# Patient Record
Sex: Female | Born: 1975 | Race: Black or African American | Hispanic: No | Smoking: Never smoker
Health system: Southern US, Community
[De-identification: ages and names within clinical notes are randomized; demographics above are authoritative.]

## PROBLEM LIST (undated history)

## (undated) DIAGNOSIS — R8290 Unspecified abnormal findings in urine: Secondary | ICD-10-CM

## (undated) DIAGNOSIS — N390 Urinary tract infection, site not specified: Secondary | ICD-10-CM

## (undated) DIAGNOSIS — G610 Guillain-Barre syndrome: Secondary | ICD-10-CM

## (undated) DIAGNOSIS — D696 Thrombocytopenia, unspecified: Secondary | ICD-10-CM

## (undated) HISTORY — DX: Unspecified abnormal findings in urine: R82.90

## (undated) HISTORY — DX: Urinary tract infection, site not specified: N39.0

## (undated) HISTORY — PX: SINUS ENDO WITH FUSION: SHX5329

---

## 2013-06-09 DIAGNOSIS — G610 Guillain-Barre syndrome: Secondary | ICD-10-CM

## 2013-06-09 DIAGNOSIS — D696 Thrombocytopenia, unspecified: Secondary | ICD-10-CM

## 2013-06-09 HISTORY — DX: Guillain-Barre syndrome: G61.0

## 2013-06-09 HISTORY — DX: Thrombocytopenia, unspecified: D69.6

## 2013-12-31 ENCOUNTER — Emergency Department (HOSPITAL_COMMUNITY)
Admission: EM | Admit: 2013-12-31 | Discharge: 2013-12-31 | Disposition: A | Discharge status: Home / Self Care | Payer: Medicaid - Out of State | Attending: Emergency Medicine | Admitting: Emergency Medicine

## 2013-12-31 ENCOUNTER — Encounter (HOSPITAL_COMMUNITY): Payer: Self-pay | Admitting: Emergency Medicine

## 2013-12-31 DIAGNOSIS — Z3202 Encounter for pregnancy test, result negative: Secondary | ICD-10-CM | POA: Insufficient documentation

## 2013-12-31 DIAGNOSIS — N390 Urinary tract infection, site not specified: Secondary | ICD-10-CM | POA: Insufficient documentation

## 2013-12-31 DIAGNOSIS — N949 Unspecified condition associated with female genital organs and menstrual cycle: Secondary | ICD-10-CM | POA: Diagnosis present

## 2013-12-31 LAB — URINE MICROSCOPIC-ADD ON

## 2013-12-31 LAB — URINALYSIS, ROUTINE W REFLEX MICROSCOPIC
Bilirubin Urine: NEGATIVE
Glucose, UA: NEGATIVE mg/dL
Hgb urine dipstick: NEGATIVE
Ketones, ur: NEGATIVE mg/dL
Nitrite: POSITIVE — AB
Protein, ur: NEGATIVE mg/dL
Specific Gravity, Urine: 1.015 (ref 1.005–1.030)
Urobilinogen, UA: 1 mg/dL (ref 0.0–1.0)
pH: 6 (ref 5.0–8.0)

## 2013-12-31 LAB — PREGNANCY, URINE: Preg Test, Ur: NEGATIVE

## 2013-12-31 MED ORDER — CEPHALEXIN 500 MG PO CAPS: 500.0000 mg | ORAL_CAPSULE | Freq: Two times a day (BID) | ORAL | Status: DC

## 2013-12-31 NOTE — ED Provider Notes (Signed)
CSN: [id number]     Arrival date & time 12/31/13  1513 History   First MD Initiated Contact with Patient 12/31/13 1523     Chief Complaint  Patient presents with  . Pelvic Pain     (Consider location/radiation/quality/duration/timing/severity/associated sxs/prior Treatment) HPI Comments: Pt states that she has been having foul smelling urine for the last 2 weeks. State that she started having intermittent sharp lower abdominal pain in the last 2 days. Denies vaginal discharge, fever,or back pain. Tried cranberry juice without success.  The history is provided by the patient. No language interpreter was used.    History reviewed. No pertinent past medical history. History reviewed. No pertinent past surgical history. History reviewed. No pertinent family history. History  Substance Use Topics  . Smoking status: Never Smoker   . Smokeless tobacco: Not on file  . Alcohol Use: Yes     Comment: occ   OB History   Grav Para Term Preterm Abortions TAB SAB Ect Mult Living                 Review of Systems  Constitutional: Negative.   Respiratory: Negative.   Cardiovascular: Negative.   Genitourinary: Positive for dysuria.      Allergies  Review of patient's allergies indicates no known allergies.  Home Medications   Prior to Admission medications   Not on File   BP 113/71  Pulse 101  Temp(Src) 99.1 F (37.3 C) (Oral)  Resp 18  Ht 5\' 7"  (1.702 m)  Wt 179 lb (81.194 kg)  BMI 28.03 kg/m2  SpO2 100%  LMP 12/25/2013 Physical Exam  Vitals reviewed. Constitutional: She is oriented to person, place, and time. She appears well-developed and well-nourished.  Cardiovascular: Normal rate and regular rhythm.   Pulmonary/Chest: Effort normal and breath sounds normal.  Abdominal: Soft. Bowel sounds are normal. There is no tenderness.  Musculoskeletal: Normal range of motion.  Neurological: She is alert and oriented to person, place, and time.  Skin: Skin is warm and dry.     ED Course  Procedures (including critical care time) Labs Review Labs Reviewed  URINALYSIS, ROUTINE W REFLEX MICROSCOPIC - Abnormal; Notable for the following:    Nitrite POSITIVE (*)    Leukocytes, UA SMALL (*)    All other components within normal limits  URINE MICROSCOPIC-ADD ON - Abnormal; Notable for the following:    Bacteria, UA MANY (*)    All other components within normal limits  URINE CULTURE  PREGNANCY, URINE    Imaging Review No results found.   EKG Interpretation None      MDM   Final diagnoses:  UTI (lower urinary tract infection)    Will treat for uti. Discussed symptoms that would warrant return with pt    Teressa Lower, NP 12/31/13 1600

## 2013-12-31 NOTE — ED Notes (Signed)
Patient states she is having sharp pelvic pain x 2 days and has noticed that her urine has a foul odor x 1 1/2 weeks.

## 2013-12-31 NOTE — Discharge Instructions (Signed)
If you continue to have symptoms when you are finished with the antibiotics or if the symptoms worsen while on the antibiotics you need to be reseen Urinary Tract Infection A urinary tract infection (UTI) can occur any place along the urinary tract. The tract includes the kidneys, ureters, bladder, and urethra. A type of germ called bacteria often causes a UTI. UTIs are often helped with antibiotic medicine.  HOME CARE   If given, take antibiotics as told by your doctor. Finish them even if you start to feel better.  Drink enough fluids to keep your pee (urine) clear or pale yellow.  Avoid tea, drinks with caffeine, and bubbly (carbonated) drinks.  Pee often. Avoid holding your pee in for a long time.  Pee before and after having sex (intercourse).  Wipe from front to back after you poop (bowel movement) if you are a woman. Use each tissue only once. GET HELP RIGHT AWAY IF:   You have back pain.  You have lower belly (abdominal) pain.  You have chills.  You feel sick to your stomach (nauseous).  You throw up (vomit).  Your burning or discomfort with peeing does not go away.  You have a fever.  Your symptoms are not better in 3 days. MAKE SURE YOU:   Understand these instructions.  Will watch your condition.  Will get help right away if you are not doing well or get worse. Document Released: 09/14/2007 Document Revised: 12/21/2011 Document Reviewed: 10/27/2011 [facility] Patient Information 2015 [place], [place]. This information is not intended to replace advice given to you by your health care provider. Make sure you discuss any questions you have with your health care provider.

## 2014-01-01 NOTE — ED Provider Notes (Signed)
Medical screening examination/treatment/procedure(s) were performed by non-physician practitioner and as supervising physician I was immediately available for consultation/collaboration.   EKG Interpretation None        [name], MD 01/01/14 1547 

## 2014-01-03 LAB — URINE CULTURE

## 2014-01-05 ENCOUNTER — Telehealth (HOSPITAL_BASED_OUTPATIENT_CLINIC_OR_DEPARTMENT_OTHER): Payer: Self-pay

## 2014-01-05 NOTE — Telephone Encounter (Signed)
Post ED Visit - Positive Culture Follow-up  Culture report reviewed by antimicrobial stewardship pharmacist: []  Wes Dulaney, Pharm.D., BCPS []  Celedonio Miyamoto, Pharm.D., BCPS []  Georgina Pillion, Pharm.D., BCPS []  [place], [address].D., BCPS, AAHIVP []  Estella Husk, Pharm.D., BCPS, AAHIVP [x]  Carly Sabat, Pharm.D. []  Enzo Bi, [address].D.  Positive Urine culture, >/= 100,000 colonies -> E Coli Treated with Cephalexin, organism sensitive to the same and no further patient follow-up is required at this time.  Arvid Right 01/05/2014, 12:57 AM

## 2014-02-17 ENCOUNTER — Encounter: Payer: Self-pay | Admitting: Adult Health

## 2014-02-17 ENCOUNTER — Other Ambulatory Visit: Payer: Self-pay | Admitting: Adult Health

## 2014-02-21 ENCOUNTER — Encounter (HOSPITAL_COMMUNITY): Payer: Self-pay | Admitting: Emergency Medicine

## 2014-02-21 ENCOUNTER — Emergency Department (HOSPITAL_COMMUNITY)
Admission: EM | Admit: 2014-02-21 | Discharge: 2014-02-21 | Discharge status: Against Medical Advice | Payer: Medicaid Other | Attending: Emergency Medicine | Admitting: Emergency Medicine

## 2014-02-21 DIAGNOSIS — R102 Pelvic and perineal pain: Secondary | ICD-10-CM | POA: Insufficient documentation

## 2014-02-21 HISTORY — DX: Guillain-Barre syndrome: G61.0

## 2014-02-21 LAB — URINALYSIS, ROUTINE W REFLEX MICROSCOPIC
Bilirubin Urine: NEGATIVE
GLUCOSE, UA: NEGATIVE mg/dL
HGB URINE DIPSTICK: NEGATIVE
KETONES UR: NEGATIVE mg/dL
LEUKOCYTES UA: NEGATIVE
Nitrite: NEGATIVE
PH: 6 (ref 5.0–8.0)
PROTEIN: NEGATIVE mg/dL
Specific Gravity, Urine: 1.025 (ref 1.005–1.030)
Urobilinogen, UA: 0.2 mg/dL (ref 0.0–1.0)

## 2014-02-21 NOTE — ED Notes (Signed)
Pt reports sharp lower pelvis pain with pungent urine odor. Denies blood but has urinary frequency. Pt denies emesis, fever or flank pain.

## 2014-03-10 ENCOUNTER — Other Ambulatory Visit: Payer: Medicaid Other | Admitting: Adult Health

## 2014-03-25 ENCOUNTER — Ambulatory Visit (INDEPENDENT_AMBULATORY_CARE_PROVIDER_SITE_OTHER): Payer: Medicaid Other | Admitting: Adult Health

## 2014-03-25 ENCOUNTER — Encounter: Payer: Self-pay | Admitting: Adult Health

## 2014-03-25 ENCOUNTER — Other Ambulatory Visit (HOSPITAL_COMMUNITY)
Admission: RE | Admit: 2014-03-25 | Discharge: 2014-03-25 | Disposition: A | Discharge status: Home / Self Care | Payer: Medicaid Other | Source: Ambulatory Visit | Attending: Adult Health | Admitting: Adult Health

## 2014-03-25 VITALS — BP 100/68 | HR 78 | Ht 66.5 in | Wt 179.5 lb

## 2014-03-25 DIAGNOSIS — Z01419 Encounter for gynecological examination (general) (routine) without abnormal findings: Secondary | ICD-10-CM

## 2014-03-25 DIAGNOSIS — N390 Urinary tract infection, site not specified: Secondary | ICD-10-CM | POA: Insufficient documentation

## 2014-03-25 DIAGNOSIS — Z113 Encounter for screening for infections with a predominantly sexual mode of transmission: Secondary | ICD-10-CM | POA: Diagnosis present

## 2014-03-25 DIAGNOSIS — Z1151 Encounter for screening for human papillomavirus (HPV): Secondary | ICD-10-CM | POA: Insufficient documentation

## 2014-03-25 DIAGNOSIS — Z0000 Encounter for general adult medical examination without abnormal findings: Secondary | ICD-10-CM

## 2014-03-25 DIAGNOSIS — R8290 Unspecified abnormal findings in urine: Secondary | ICD-10-CM | POA: Insufficient documentation

## 2014-03-25 DIAGNOSIS — Z1212 Encounter for screening for malignant neoplasm of rectum: Secondary | ICD-10-CM

## 2014-03-25 HISTORY — DX: Urinary tract infection, site not specified: N39.0

## 2014-03-25 HISTORY — DX: Unspecified abnormal findings in urine: R82.90

## 2014-03-25 LAB — HEMOCCULT GUIAC POC 1CARD (OFFICE): Fecal Occult Blood, POC: NEGATIVE

## 2014-03-25 LAB — POCT URINALYSIS DIPSTICK
Blood, UA: NEGATIVE
GLUCOSE UA: NEGATIVE
LEUKOCYTES UA: NEGATIVE
NITRITE UA: POSITIVE
Protein, UA: NEGATIVE

## 2014-03-25 MED ORDER — NITROFURANTOIN MONOHYD MACRO 100 MG PO CAPS: 100.0000 mg | ORAL_CAPSULE | Freq: Two times a day (BID) | ORAL | Status: DC

## 2014-03-25 NOTE — Progress Notes (Signed)
Patient ID: Stacey Fisher, female   DOB: 1976-04-03, 38 y.o.   MRN: [id number] History of Present Illness: Stacey Fisher is a 38 year old black female, divorced, in relationship, new to this practice, in for pap and physical and she complains of odor in urine and breast tenderness.She is not using birth control as partner wants a baby.   Current Medications, Allergies, Past Medical History, Past Surgical History, Family History and Social History were reviewed in [employer] record.     Review of Systems: Patient denies any headaches, blurred vision, shortness of breath, chest pain, abdominal pain, problems with bowel movements, urination, or intercourse. 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    Physical Exam:BP 100/68 mmHg  Pulse 78  Ht 5' 6.5" (1.689 m)  Wt 179 lb 8 oz (81.421 kg)  BMI 28.54 kg/m2  LMP 03/12/2014 Urine + nitrates General:  Well developed, well nourished, no acute distress Skin:  Warm and dry Neck:  Midline trachea, normal thyroid Lungs; Clear to auscultation bilaterally Breast:  No dominant palpable mass, retraction, or nipple discharge, has bilateral regular irregularities  Cardiovascular: Regular rate and rhythm Abdomen:  Soft, non tender, no hepatosplenomegaly Pelvic:  External genitalia is normal in appearance.  The vagina is normal in appearance. The cervix is bulbous.  Uterus is felt to be normal size, shape, and contour.  No  adnexal masses or tenderness noted. Rectal: Good sphincter tone, no polyps, or hemorrhoids felt.  Hemoccult negative. Extremities:  No swelling or varicosities noted Psych:  No mood changes,alert and cooperative,seems happy Discussed with her age increased risk for Down's.  Impression: Well woman gyn exam with pap in new pt Bad odor of urine UTI   Plan: Rx macrobid 1 bid x 7 days #14 no refills UA C&S sent Take folic acid Increase water Get mammogram now,  number given for [facility] Physical in 1 year Review handouts on UTI and preparing for pregnancy

## 2014-03-25 NOTE — Patient Instructions (Addendum)
Get mammogram now [phone] Physical in 1 year Take folic acid Increase water Take macrobid for UTI Urinary Tract Infection Urinary tract infections (UTIs) can develop anywhere along your urinary tract. Your urinary tract is your body's drainage system for removing wastes and extra water. Your urinary tract includes two kidneys, two ureters, a bladder, and a urethra. Your kidneys are a pair of bean-shaped organs. Each kidney is about the size of your fist. They are located below your ribs, one on each side of your spine. CAUSES Infections are caused by microbes, which are microscopic organisms, including fungi, viruses, and bacteria. These organisms are so small that they can only be seen through a microscope. Bacteria are the microbes that most commonly cause UTIs. SYMPTOMS  Symptoms of UTIs may vary by age and gender of the patient and by the location of the infection. Symptoms in young women typically include a frequent and intense urge to urinate and a painful, burning feeling in the bladder or urethra during urination. Older women and men are more likely to be tired, shaky, and weak and have muscle aches and abdominal pain. A fever may mean the infection is in your kidneys. Other symptoms of a kidney infection include pain in your back or sides below the ribs, nausea, and vomiting. DIAGNOSIS To diagnose a UTI, your caregiver will ask you about your symptoms. Your caregiver also will ask to provide a urine sample. The urine sample will be tested for bacteria and white blood cells. White blood cells are made by your body to help fight infection. TREATMENT  Typically, UTIs can be treated with medication. Because most UTIs are caused by a bacterial infection, they usually can be treated with the use of antibiotics. The choice of antibiotic and length of treatment depend on your symptoms and the type of bacteria causing your infection. HOME CARE INSTRUCTIONS  If you were prescribed antibiotics, take  them exactly as your caregiver instructs you. Finish the medication even if you feel better after you have only taken some of the medication.  Drink enough water and fluids to keep your urine clear or pale yellow.  Avoid caffeine, tea, and carbonated beverages. They tend to irritate your bladder.  Empty your bladder often. Avoid holding urine for long periods of time.  Empty your bladder before and after sexual intercourse.  After a bowel movement, women should cleanse from front to back. Use each tissue only once. SEEK MEDICAL CARE IF:   You have back pain.  You develop a fever.  Your symptoms do not begin to resolve within 3 days. SEEK IMMEDIATE MEDICAL CARE IF:   You have severe back pain or lower abdominal pain.  You develop chills.  You have nausea or vomiting.  You have continued burning or discomfort with urination. MAKE SURE YOU:   Understand these instructions.  Will watch your condition.  Will get help right away if you are not doing well or get worse. Document Released: 01/05/2005 Document Revised: 09/27/2011 Document Reviewed: 05/06/2011 [facility] Patient Information 2015 [place], [place]. This information is not intended to replace advice given to you by your health care provider. Make sure you discuss any questions you have with your health care provider. Preparing for Pregnancy Before trying to become pregnant, make an appointment with your health care provider (preconception care). The goal is to help you have a healthy, safe pregnancy. At your first appointment, your health care provider will:   Do a complete physical exam, including a Pap test.  Take a complete medical history.  Give you advice and help you resolve any problems. PRECONCEPTION CHECKLIST Here is a list of the basics to cover with your health care provider at your preconception visit:  Medical history.  Tell your health care provider about any diseases you have had. Many diseases can  affect your pregnancy.  Include your partner's medical history and family history.  Make sure you have been tested for sexually transmitted infections (STIs). These can affect your pregnancy. In some cases, they can be passed to your baby. Tell your health care provider about any history of STIs.  Make sure your health care provider knows about any previous problems you have had with conception or pregnancy.  Tell your health care provider about any medicine you take. This includes herbal supplements and over-the-counter medicines.  Make sure all your immunizations are up to date. You may need to make additional appointments.  Ask your health care provider if you need any vaccinations or if there are any you should avoid.  Diet.  It is especially important to eat a healthy, balanced diet with the right nutrients when you are pregnant.  Ask your health care provider to help you get to a healthy weight before pregnancy.  If you are overweight, you are at higher risk for certain complications. These include high blood pressure, diabetes, and preterm birth.  If you are underweight, you are more likely to have a low-birth-weight baby.  Lifestyle.  Tell your health care provider about lifestyle factors such as alcohol use, drug use, or smoking.  Describe any harmful substances you may be exposed to at work or home. These can include chemicals, pesticides, and radiation.  Mental health.  Let your health care provider know if you have been feeling depressed or anxious.  Let your health care provider know if you have a history of substance abuse.  Let your health care provider know if you do not feel safe at home. HOME INSTRUCTIONS TO PREPARE FOR PREGNANCY Follow your health care provider's advice and instructions.   Keep an accurate record of your menstrual periods. This makes it easier for your health care provider to determine your baby's due date.  Begin taking prenatal vitamins  and folic acid supplements daily. Take them as directed by your health care provider.  Eat a balanced diet. Get help from a nutrition counselor if you have questions or need help.  Get regular exercise. Try to be active for at least 30 minutes a day most days of the week.  Quit smoking, if you smoke.  Do not drink alcohol.  Do not take illegal drugs.  Get medical problems, such as diabetes or high blood pressure, under control.  If you have diabetes, make sure you do the following:  Have good blood sugar control. If you have type 1 diabetes, use multiple daily doses of insulin. Do not use split-dose or premixed insulin.  Have an eye exam by a qualified eye care professional trained in caring for people with diabetes.  Get evaluated by your health care provider for cardiovascular disease.  Get to a healthy weight. If you are overweight or obese, reduce your weight with the help of a qualified health professional such as a Museum/gallery exhibitions officerregistered dietitian. Ask your health care provider what the right weight range is for you. HOW DO I KNOW I AM PREGNANT? You may be pregnant if you have been sexually active and you miss your period. Symptoms of early pregnancy include:   Mild  cramping.  Very light vaginal bleeding (spotting).  Feeling unusually tired.  Morning sickness. If you have any of these symptoms, take a home pregnancy test. These tests look for a hormone called human chorionic gonadotropin (hCG) in your urine. Your body begins to make this hormone during early pregnancy. These tests are very accurate. Wait until at least the first day you miss your period to take one. If you get a positive result, call your health care provider to make appointments for prenatal care. WHAT SHOULD I DO IF I BECOME PREGNANT?  Make an appointment with your health care provider by week 12 of your pregnancy at the latest.  Do not smoke. Smoking can be harmful to your baby.  Do not drink alcoholic beverages.  Alcohol is related to a number of birth defects.  Avoid toxic odors and chemicals.  You may continue to have sexual intercourse if it does not cause pain or other problems, such as vaginal bleeding. Document Released: 03/10/2008 Document Revised: 08/12/2013 Document Reviewed: 03/04/2013 [facility] Patient Information 2015 [place], [place]. This information is not intended to replace advice given to you by your health care provider. Make sure you discuss any questions you have with your health care provider.

## 2014-03-26 LAB — URINALYSIS
BILIRUBIN URINE: NEGATIVE
GLUCOSE, UA: NEGATIVE mg/dL
Hgb urine dipstick: NEGATIVE
Ketones, ur: NEGATIVE mg/dL
LEUKOCYTES UA: NEGATIVE
Nitrite: POSITIVE — AB
PH: 7.5 (ref 5.0–8.0)
PROTEIN: NEGATIVE mg/dL
Specific Gravity, Urine: 1.016 (ref 1.005–1.030)
Urobilinogen, UA: 1 mg/dL (ref 0.0–1.0)

## 2014-03-26 LAB — CYTOLOGY - PAP

## 2014-03-28 LAB — URINE CULTURE: Colony Count: >=100000

## 2014-04-16 ENCOUNTER — Other Ambulatory Visit: Payer: Self-pay | Admitting: Adult Health

## 2014-04-16 DIAGNOSIS — Z1231 Encounter for screening mammogram for malignant neoplasm of breast: Secondary | ICD-10-CM

## 2014-04-21 ENCOUNTER — Ambulatory Visit (HOSPITAL_COMMUNITY): Payer: Medicaid - Out of State

## 2014-04-28 ENCOUNTER — Ambulatory Visit (HOSPITAL_COMMUNITY)
Admission: RE | Admit: 2014-04-28 | Discharge: 2014-04-28 | Disposition: A | Discharge status: Home / Self Care | Payer: Medicaid Other | Source: Ambulatory Visit | Attending: Adult Health | Admitting: Adult Health

## 2014-04-28 DIAGNOSIS — R928 Other abnormal and inconclusive findings on diagnostic imaging of breast: Secondary | ICD-10-CM | POA: Diagnosis not present

## 2014-04-28 DIAGNOSIS — Z1231 Encounter for screening mammogram for malignant neoplasm of breast: Secondary | ICD-10-CM | POA: Diagnosis not present

## 2014-05-01 ENCOUNTER — Other Ambulatory Visit: Payer: Self-pay | Admitting: Adult Health

## 2014-05-01 DIAGNOSIS — R928 Other abnormal and inconclusive findings on diagnostic imaging of breast: Secondary | ICD-10-CM

## 2014-05-06 ENCOUNTER — Encounter (HOSPITAL_COMMUNITY): Payer: Self-pay | Admitting: Emergency Medicine

## 2014-05-06 ENCOUNTER — Inpatient Hospital Stay (HOSPITAL_COMMUNITY)
Admission: EM | Admit: 2014-05-06 | Discharge: 2014-05-08 | DRG: 060 | Disposition: A | Discharge status: Home / Self Care | Payer: Medicaid Other | Attending: Internal Medicine | Admitting: Internal Medicine

## 2014-05-06 ENCOUNTER — Emergency Department (HOSPITAL_COMMUNITY): Payer: Medicaid Other

## 2014-05-06 DIAGNOSIS — G35D Multiple sclerosis, unspecified: Secondary | ICD-10-CM

## 2014-05-06 DIAGNOSIS — G35 Multiple sclerosis: Principal | ICD-10-CM | POA: Diagnosis present

## 2014-05-06 DIAGNOSIS — R531 Weakness: Secondary | ICD-10-CM

## 2014-05-06 DIAGNOSIS — R200 Anesthesia of skin: Secondary | ICD-10-CM

## 2014-05-06 HISTORY — DX: Thrombocytopenia, unspecified: D69.6

## 2014-05-06 LAB — URINALYSIS, ROUTINE W REFLEX MICROSCOPIC
Bilirubin Urine: NEGATIVE
GLUCOSE, UA: NEGATIVE mg/dL
KETONES UR: NEGATIVE mg/dL
NITRITE: NEGATIVE
Protein, ur: NEGATIVE mg/dL
SPECIFIC GRAVITY, URINE: 1.015 (ref 1.005–1.030)
UROBILINOGEN UA: 1 mg/dL (ref 0.0–1.0)
pH: 7 (ref 5.0–8.0)

## 2014-05-06 LAB — CBC WITH DIFFERENTIAL/PLATELET
Basophils Absolute: 0 10*3/uL (ref 0.0–0.1)
Basophils Relative: 1 % (ref 0–1)
EOS ABS: 0.4 10*3/uL (ref 0.0–0.7)
Eosinophils Relative: 10 % — ABNORMAL HIGH (ref 0–5)
HCT: 39.6 % (ref 36.0–46.0)
Hemoglobin: 12.7 g/dL (ref 12.0–15.0)
LYMPHS ABS: 1.4 10*3/uL (ref 0.7–4.0)
LYMPHS PCT: 32 % (ref 12–46)
MCH: 29.3 pg (ref 26.0–34.0)
MCHC: 32.1 g/dL (ref 30.0–36.0)
MCV: 91.5 fL (ref 78.0–100.0)
Monocytes Absolute: 0.6 10*3/uL (ref 0.1–1.0)
Monocytes Relative: 14 % — ABNORMAL HIGH (ref 3–12)
NEUTROS ABS: 1.9 10*3/uL (ref 1.7–7.7)
NEUTROS PCT: 43 % (ref 43–77)
PLATELETS: 154 10*3/uL (ref 150–400)
RBC: 4.33 MIL/uL (ref 3.87–5.11)
RDW: 14 % (ref 11.5–15.5)
WBC: 4.3 10*3/uL (ref 4.0–10.5)

## 2014-05-06 LAB — BASIC METABOLIC PANEL
BUN: 10 mg/dL (ref 6–23)
CALCIUM: 8.5 mg/dL (ref 8.4–10.5)
CHLORIDE: 109 mmol/L (ref 96–112)
CO2: 28 mmol/L (ref 19–32)
CREATININE: 0.89 mg/dL (ref 0.50–1.10)
GFR calc non Af Amer: 80 mL/min — ABNORMAL LOW (ref >90)
Glucose, Bld: 92 mg/dL (ref 70–99)
Potassium: 3.7 mmol/L (ref 3.5–5.1)
SODIUM: 139 mmol/L (ref 135–145)

## 2014-05-06 LAB — POC URINE PREG, ED: PREG TEST UR: NEGATIVE

## 2014-05-06 LAB — URINE MICROSCOPIC-ADD ON

## 2014-05-06 LAB — TSH: TSH: 0.573 u[IU]/mL (ref 0.350–4.500)

## 2014-05-06 MED ORDER — METHYLPREDNISOLONE SODIUM SUCC 1000 MG IJ SOLR
INTRAMUSCULAR | Status: AC
  Filled 2014-05-06: qty 8

## 2014-05-06 MED ORDER — MORPHINE SULFATE 2 MG/ML IJ SOLN
2.0000 mg | INTRAMUSCULAR | Status: DC | PRN
  Filled 2014-05-06: qty 1

## 2014-05-06 MED ORDER — HEPARIN SODIUM (PORCINE) 5000 UNIT/ML IJ SOLN
5000.0000 [IU] | Freq: Three times a day (TID) | INTRAMUSCULAR | Status: DC
  Administered 2014-05-06: 5000 [IU] via SUBCUTANEOUS
  Filled 2014-05-06: qty 1

## 2014-05-06 MED ORDER — GADOBENATE DIMEGLUMINE 529 MG/ML IV SOLN
15.0000 mL | Freq: Once | INTRAVENOUS | Status: AC | PRN
  Administered 2014-05-06: 15 mL via INTRAVENOUS

## 2014-05-06 MED ORDER — METHYLPREDNISOLONE SODIUM SUCC 125 MG IJ SOLR: 60.0000 mg | Freq: Four times a day (QID) | INTRAMUSCULAR | Status: DC

## 2014-05-06 MED ORDER — METHYLPREDNISOLONE SODIUM SUCC 1000 MG IJ SOLR
1000.0000 mg | INTRAMUSCULAR | Status: DC
  Administered 2014-05-06 – 2014-05-08 (×3): 1000 mg via INTRAVENOUS
  Filled 2014-05-06 (×3): qty 8

## 2014-05-06 MED ORDER — SODIUM CHLORIDE 0.9 % IV SOLN
Freq: Once | INTRAVENOUS | Status: AC
  Administered 2014-05-06: 09:00:00 via INTRAVENOUS

## 2014-05-06 MED ORDER — SODIUM CHLORIDE 0.9 % IV SOLN
1000.0000 mg | Freq: Once | INTRAVENOUS | Status: AC
  Administered 2014-05-06: 1000 mg via INTRAVENOUS
  Filled 2014-05-06: qty 8

## 2014-05-06 MED ORDER — ACETAMINOPHEN 325 MG PO TABS
650.0000 mg | ORAL_TABLET | Freq: Four times a day (QID) | ORAL | Status: DC | PRN
  Administered 2014-05-07 – 2014-05-08 (×2): 650 mg via ORAL
  Filled 2014-05-06 (×3): qty 2

## 2014-05-06 MED ORDER — METHYLPREDNISOLONE SODIUM SUCC 125 MG IJ SOLR: 1000.0000 mg | Freq: Once | INTRAMUSCULAR | Status: DC

## 2014-05-06 MED ORDER — ACETAMINOPHEN 650 MG RE SUPP: 650.0000 mg | Freq: Four times a day (QID) | RECTAL | Status: DC | PRN

## 2014-05-06 NOTE — Consult Note (Signed)
Felsenthal A. Stacey Laughter, MD     [email or web address]          Stacey Fisher is an 39 y.o. female.   ASSESSMENT/PLAN: 1. Acute/subacute numbness involving the left truncal region. The MRI findings, age and prior presentation makes the multiple sclerosis most likely etiology. 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 The patient will be initiated on high dose Solu-Medrol 1 g IV for 3-4 days. We will do a spinal tap obtaining the MS panel and also routine labs. Extensive additional labs will be obtained including ANA, sedimentation rate, C-reactive protein and other labs. Spinal fluid also be assessed for neurosarcoidosis. Also I may possibly consider titers for Sjogren's syndrome. The case is discussed quite lengthily with the patient. She will need to be placed on immune-modulating therapy at some point in time. Initial discussions were had regarding this but further discussion will need to be made at a later date.   This is a 39 year old black female who presents with nose and tingling involving the left truncal region over the last 3 days. The patient decided to seek medical attention as the symptoms were not improving and may be getting worse. There are some reports of gait imbalance. She had similar complaints about a year ago March 2015. At times she developed numbness from the waist down. She was treated at the hospital in [place] in the [place] area. She tells me that she did have a spinal tap and did have a lumbar spine MRI. However, she does not report having any thoracic or cervical spine MRI. MRI of the brain was not done. She tells me that they did raise concern of possible MS but Guillain-Barr was made the diagnosis. A spinal tap was done. I did ask her about loss her reflexes but she tell me that she did not lose reflexes at that time. This is  true this would argue against a diagnosis Guillain-Barr syndrome. In any case, she did receive the typical dose of immunoglobulins and had a very good response. The patient denies any focal weakness at this time. She denies any diplopia, dysarthria, loss of vision or other symptoms. The review of systems otherwise negative.  GENERAL: This is a pleasant female in no acute distress.  HEENT: Supple. Atraumatic normocephalic.   ABDOMEN: soft  EXTREMITIES: No edema   BACK: Normal.  SKIN: Normal by inspection.    MENTAL STATUS: Alert and oriented. Speech, language and cognition are generally intact. Judgment and insight normal.   CRANIAL NERVES: Pupils are equal, round and reactive to light and accommodation; extra ocular movements are full, there is no significant nystagmus; visual fields are full; upper and lower facial muscles are normal in strength and symmetric, there is no flattening of the nasolabial folds; tongue is midline; uvula is midline; shoulder elevation is normal.  MOTOR: Normal tone, bulk and strength; no pronator drift.  COORDINATION: Left finger to nose is normal, right finger to nose is normal, No rest tremor; no intention tremor; no postural tremor; no bradykinesia.  REFLEXES: Deep tendon reflexes are symmetrical and normal. Babinski reflexes are flexor bilaterally.   SENSATION: Reduced sensation to temperature light touch involving the left thoracic and abdominal region extending from approximately L1 to about T5.  The brain MRI is reviewed in person. They are several lesions seen on diffusion imaging which are perpendicular to the ventricle. Most of these are moderate in  size. I counted approximately 10 including a cerebellar lesion on the left which enhances. There are 2 large white matter lesions involving the corona radiata on the right side. I suspect these lesions most likely experiencing her symptoms at this time.  The cervical spine MRI is also reviewed and shows  4 increased signal seen on T2. These are consistent with multiple sclerosis.   Blood pressure 121/71, pulse 63, temperature 98.7 F (37.1 C), temperature source Oral, resp. rate 16, height _0  (1.702 m), weight 82.555 kg (182 lb), last menstrual period 05/06/2014, SpO2 100 %.  Past Medical History  Diagnosis Date  . Guillain-Barre 06/2013    "presumed"  . Bad odor of urine 03/25/2014  . UTI (lower urinary tract infection) 03/25/2014  . Thrombocytopenia 06/2013    Past Surgical History  Procedure Laterality Date  . Sinus endo with fusion      Family History  Problem Relation Age of Onset  . Cancer Other     maternal great grandma-colon cancer  . Asthma Daughter   . Hyperlipidemia Maternal Grandmother   . Diabetes Paternal Grandmother   . Hypertension Paternal Grandmother   . Stroke Paternal Grandmother   . Asthma Daughter   . Other Father     Guillain-Barre    Social History:  reports that she has never smoked. She has never used smokeless tobacco. She reports that she drinks about 3.0 oz of alcohol per week. She reports that she does not use illicit drugs.  Allergies: No Known Allergies  Medications: Prior to Admission medications   Medication Sig Start Date End Date Taking? Authorizing Provider  nitrofurantoin, macrocrystal-monohydrate, (MACROBID) 100 MG capsule Take 1 capsule (100 mg total) by mouth 2 (two) times daily. Patient not taking: Reported on 05/06/2014 03/25/14   Stacey Dooms, NP    Scheduled Meds: . heparin  5,000 Units Subcutaneous 3 times per day  . methylPREDNISolone (SOLU-MEDROL) injection  60 mg Intravenous Q6H   Continuous Infusions:  PRN Meds:.acetaminophen **OR** acetaminophen, morphine injection     Results for orders placed or performed during the hospital encounter of 05/06/14 (from the past 48 hour(s))  Urinalysis, Routine w reflex microscopic     Status: Abnormal   Collection Time: 05/06/14  8:42 AM  Result Value Ref Range    Color, Urine YELLOW YELLOW   APPearance CLEAR CLEAR   Specific Gravity, Urine 1.015 1.005 - 1.030   pH 7.0 5.0 - 8.0   Glucose, UA NEGATIVE NEGATIVE mg/dL   Hgb urine dipstick LARGE (A) NEGATIVE   Bilirubin Urine NEGATIVE NEGATIVE   Ketones, ur NEGATIVE NEGATIVE mg/dL   Protein, ur NEGATIVE NEGATIVE mg/dL   Urobilinogen, UA 1.0 0.0 - 1.0 mg/dL   Nitrite NEGATIVE NEGATIVE   Leukocytes, UA TRACE (A) NEGATIVE  Urine microscopic-add on     Status: Abnormal   Collection Time: 05/06/14  8:42 AM  Result Value Ref Range   Squamous Epithelial / LPF MANY (A) RARE   RBC / HPF TOO NUMEROUS TO COUNT <3 RBC/hpf   Bacteria, UA MANY (A) RARE  POC Urine Pregnancy, ED (do NOT order at [facility])     Status: None   Collection Time: 05/06/14  8:44 AM  Result Value Ref Range   Preg Test, Ur NEGATIVE NEGATIVE    Comment:        THE SENSITIVITY OF THIS METHODOLOGY IS >24 mIU/mL   CBC with Differential     Status: Abnormal   Collection Time: 05/06/14  8:45 AM  Result Value Ref Range   WBC 4.3 4.0 - 10.5 K/uL   RBC 4.33 3.87 - 5.11 MIL/uL   Hemoglobin 12.7 12.0 - 15.0 g/dL   HCT 39.6 36.0 - 46.0 %   MCV 91.5 78.0 - 100.0 fL   MCH 29.3 26.0 - 34.0 pg   MCHC 32.1 30.0 - 36.0 g/dL   RDW 14.0 11.5 - 15.5 %   Platelets 154 150 - 400 K/uL   Neutrophils Relative % 43 43 - 77 %   Neutro Abs 1.9 1.7 - 7.7 K/uL   Lymphocytes Relative 32 12 - 46 %   Lymphs Abs 1.4 0.7 - 4.0 K/uL   Monocytes Relative 14 (H) 3 - 12 %   Monocytes Absolute 0.6 0.1 - 1.0 K/uL   Eosinophils Relative 10 (H) 0 - 5 %   Eosinophils Absolute 0.4 0.0 - 0.7 K/uL   Basophils Relative 1 0 - 1 %   Basophils Absolute 0.0 0.0 - 0.1 K/uL  Basic metabolic panel     Status: Abnormal   Collection Time: 05/06/14  8:45 AM  Result Value Ref Range   Sodium 139 135 - 145 mmol/L   Potassium 3.7 3.5 - 5.1 mmol/L   Chloride 109 96 - 112 mmol/L   CO2 28 19 - 32 mmol/L   Glucose, Bld 92 70 - 99 mg/dL   BUN 10 6 - 23 mg/dL   Creatinine, Ser 0.89 0.50  - 1.10 mg/dL   Calcium 8.5 8.4 - 10.5 mg/dL   GFR calc non Af Amer 80 (L) >90 mL/min   GFR calc Af Amer >90 >90 mL/min    Comment: (NOTE) The eGFR has been calculated using the CKD EPI equation. This calculation has not been validated in all clinical situations. eGFR's persistently <90 mL/min signify possible Chronic Kidney Disease.    Anion gap NOT CALCULATED 5 - 15    Studies/Results:  MRI BRAIN Multiple small white matter lesions bilaterally. Many of these are periventricular in location and are characteristic of multiple sclerosis. Small enhancing lesion in the right cerebellum may be an area of acute demyelination.  Negative for acute infarct or mass.  CSPINE MRI Multiple nonenhancing areas of white matter disease in the cervical spinal cord consistent with multiple sclerosis. The findings are not consistent with Guillain-Barre syndrome.   Leston Schueller A. Stacey Fisher, M.D.  Diplomate, Tax adviser of Psychiatry and Neurology ( Neurology). 05/06/2014, 6:39 PM

## 2014-05-06 NOTE — ED Notes (Signed)
MD at bedside. 

## 2014-05-06 NOTE — Care Management Note (Signed)
    Page 1 of 1   05/08/2014     12:42:15 PM CARE MANAGEMENT NOTE 05/08/2014  Patient:  Stacey Fisher, Stacey Fisher   Account Number:  [id number]  Date Initiated:  05/06/2014  Documentation initiated by:  Anibal Henderson  Subjective/Objective Assessment:   pt is being admitted with possible new onset of MS. She lives at home with spouse, and is usually independent. Recent Dx of possible Guillain-Barre in March,  but was doing well until several days ago when she developed lt sided tingling     Action/Plan:   and numbness- MRI shows possible MS. Dr Gerilyn Pilgrim consulting. Pt plans to return home at D/C- may need DME   Anticipated DC Date:  05/07/2014   Anticipated DC Plan:           Choice offered to / List presented to:             Status of service:  In process, will continue to follow Medicare Important Message given?   (If response is "NO", the following Medicare IM given date fields will be blank) Date Medicare IM given:   Medicare IM given by:   Date Additional Medicare IM given:   Additional Medicare IM given by:    Discharge Disposition:    Per UR Regulation:    If discussed at Long Length of Stay Meetings, dates discussed:    Comments:  05/08/14 1235 Arlyss Queen, RN BSN CM Pt discharged home today. Pt stated she saw a Dr. French Ana recently for a physical but she has a new doctor that she is calling to make followup appt with at discharge. No other CM needs noted. Pt also will follow up with Dr. Gerilyn Pilgrim.  05/06/14 1430 Geneva bolden RN/CM pt has no PCP in the area. Was given the [employer]

## 2014-05-06 NOTE — H&P (Signed)
[facility] History and Physical  Stacey Fisher ZOX:[id number] DOB: 1975-07-13 DOA: 05/06/2014  Referring physician: Emergency Department PCP: No PCP Per Patient   Chief Complaint: Numbness  HPI: Stacey Fisher is a 39 y.o. female  With no significant past medical history who presented to the ED with worsening descending weakness that started recently. 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 In the Ed, Neurology was initially consulted, who recommended MRI of the brain and c spine. Imaging demonstrated multiple small white matter lesions bilaterally suggestive of MS. Neurology recommended medical admission. Pt was started on IV steroids and hospitalist consulted for admission.   Review of Systems:  Per above, the remainder of the 10pt ros reviewed and are neg  Past Medical History  Diagnosis Date  . Guillain-Barre 06/2013    "presumed"  . Bad odor of urine 03/25/2014  . UTI (lower urinary tract infection) 03/25/2014  . Thrombocytopenia 06/2013   Past Surgical History  Procedure Laterality Date  . Sinus endo with fusion     Social History:  reports that she has never smoked. She has never used smokeless tobacco. She reports that she drinks about 3.0 oz of alcohol per week. She reports that she does not use illicit drugs.  No Known Allergies  Family History  Problem Relation Age of Onset  . Cancer Other     maternal great grandma-colon cancer  . Asthma Daughter   . Hyperlipidemia Maternal Grandmother   . Diabetes Paternal Grandmother   . Hypertension Paternal Grandmother   . Stroke Paternal Grandmother   . Asthma Daughter   . Other Father     Guillain-Barre    do not leave blank  Prior to Admission medications   Medication Sig Start Date End Date Taking? Authorizing Provider  nitrofurantoin, macrocrystal-monohydrate, (MACROBID) 100 MG capsule Take 1 capsule (100 mg total) by mouth 2 (two) times daily. Patient not taking: Reported on 05/06/2014  03/25/14   Adline Potter, NP   Physical Exam: Filed Vitals:   05/06/14 0850 05/06/14 0900 05/06/14 0930 05/06/14 1125  BP: 101/74   106/69  Pulse: 60 57 65 57  Temp:      TempSrc:      Resp: Height:      Weight:      SpO2: 100% 100% 100% 100%    Wt Readings from Last 3 Encounters:  05/06/14 82.555 kg (182 lb)  05/06/14 82.555 kg (182 lb)  05/06/14 82.555 kg (182 lb)    General:  Appears calm and comfortable Eyes: PERRL, normal lids, irises & conjunctiva ENT: grossly normal hearing, lips & tongue Neck: no LAD, masses or thyromegaly Cardiovascular: RRR, no m/r/g. No LE edema. Telemetry: SR, no arrhythmias  Respiratory: CTA bilaterally, no w/r/r. Normal respiratory effort. Abdomen: soft, ntnd Skin: no rash or induration seen on limited exam Musculoskeletal: grossly normal tone BUE/BLE Psychiatric: grossly normal mood and affect, speech fluent and appropriate Neurologic: slightly weakness over LLE, L sided numbness          Labs on Admission:  Basic Metabolic Panel:  Recent Labs Lab 05/06/14 0845  NA 139  K 3.7  CL 109  CO2 28  GLUCOSE 92  BUN 10  CREATININE 0.89  CALCIUM 8.5   Liver Function Tests: No results for input(s): AST, ALT, ALKPHOS, BILITOT, PROT, ALBUMIN in the last 168 hours. No results for input(s): LIPASE, AMYLASE in the last 168 hours. No results for input(s): AMMONIA in the last  168 hours. CBC:  Recent Labs Lab 05/06/14 0845  WBC 4.3  NEUTROABS 1.9  HGB 12.7  HCT 39.6  MCV 91.5  PLT 154   Cardiac Enzymes: No results for input(s): CKTOTAL, CKMB, CKMBINDEX, TROPONINI in the last 168 hours.  BNP (last 3 results) No results for input(s): PROBNP in the last 8760 hours. CBG: No results for input(s): GLUCAP in the last 168 hours.  Radiological Exams on Admission: Dg Chest 2 View  05/06/2014   CLINICAL DATA:  LEFT-sided numbness from buttock to mid rib area, chest tightness on Friday, history of Guillain-Barre  EXAM:  CHEST  2 VIEW  COMPARISON:  None  FINDINGS: Normal heart size, mediastinal contours, and pulmonary vascularity.  Lungs well expanded and clear.  No pleural effusion or pneumothorax.  Bones unremarkable.  IMPRESSION: No acute abnormalities.   Electronically Signed   By: Ulyses Southward M.D.   On: 05/06/2014 09:20   Mr Laqueta Jean ZO Contrast  05/06/2014   CLINICAL DATA:  Numbness bilateral lower extremities  EXAM: MRI HEAD WITHOUT AND WITH CONTRAST  TECHNIQUE: Multiplanar, multiecho pulse sequences of the brain and surrounding structures were obtained without and with intravenous contrast.  CONTRAST:  15mL MULTIHANCE GADOBENATE DIMEGLUMINE 529 MG/ML IV SOLN  COMPARISON:  Cervical MRI 05/06/2014  FINDINGS: Ventricle size is normal. Cerebral volume normal. Craniocervical junction demonstrates low lying cerebellar tonsils without Chiari malformation. Pituitary not enlarged.  Multiple cerebral white matter lesions are present bilaterally. Many of these are small and periventricular in location, typical for demyelinating disease. There is also a lesion in the posterior limb internal capsule on the right. There is a small lesion in the splenium of the corpus callosum on the right. There are parietal periventricular white matter lesions and a right temporal periventricular white matter lesion. There is a small lesion in the right cerebellum measuring 5 mm which shows enhancement. No other enhancing lesions identified.  Possible small area of hyperintensity in the spinal cord on the left at C1 best seen on coronal FLAIR.  Diffusion-weighted imaging is negative. No acute infarct or restricted diffusion in the white matter lesions.  Negative for mass or edema.  Negative for intracranial hemorrhage  Mucosal thickening in the paranasal sinuses.  Mild enhancement of a small right cerebellar lesion as noted above. No other enhancing lesions identified.  IMPRESSION: Multiple small white matter lesions bilaterally. Many of these are  periventricular in location and are characteristic of multiple sclerosis. Small enhancing lesion in the right cerebellum may be an area of acute demyelination.  Negative for acute infarct or mass.   Electronically Signed   By: Marlan Palau M.D.   On: 05/06/2014 11:48   Mr Cervical Spine W Wo Contrast  05/06/2014   CLINICAL DATA:  New diagnosis of Guillain-Barre. Bilateral lower extremity numbness.  EXAM: MRI CERVICAL SPINE WITHOUT AND WITH CONTRAST  TECHNIQUE: Multiplanar and multiecho pulse sequences of the cervical spine, to include the craniocervical junction and cervicothoracic junction, were obtained according to standard protocol without and with intravenous contrast.  CONTRAST:  15mL MULTIHANCE GADOBENATE DIMEGLUMINE 529 MG/ML IV SOLN  COMPARISON:  None.  FINDINGS: The patient has multiple scattered areas of abnormal increased signal intensity from the cervical spinal cord. Involved areas are scattered throughout the spinal cord, some being lateral, some anterior and some posterior.  There is no appreciable enhancement of these abnormalities after contrast administration.  The paraspinal soft tissues are normal.  C1-2 and C2-3:  Normal.  C3-4: Tiny disc bulge to  the right of midline with no neural impingement.  C4-5:  Small disc protrusion into the left lateral recess.  C5-6: Minimal disc bulge and osteophytes extend to the left of midline with no neural impingement.  C6-7:  Small broad-based disc bulge with no neural impingement.  C7-T1:  Normal.  There is no cervical spinal stenosis, foraminal stenosis, or facet arthritis. There are no mass lesions. There is no spinal cord compression.  IMPRESSION: Multiple nonenhancing areas of white matter disease in the cervical spinal cord consistent with multiple sclerosis. The findings are not consistent with Guillain-Barre syndrome.   Electronically Signed   By: Geanie Cooley M.D.   On: 05/06/2014 11:46    Assessment/Plan Principal Problem:   Multiple  sclerosis Active Problems:   Numbness   1. Multiple Sclerosis 1. White matter changes evident on MRI brain 2. Neurology consulted through the ED 3. Will continue on IV steroids 4. PT/OT 5. Admit to med-surg 2. DVT prophylaxis 1. Heparin subQ  Code Status: Full  DVT Prophylaxis: Family Communication: Pt in room  Disposition Plan: Pending  Time spent:  CHIU, STEPHEN K [facility] Pager [phone]

## 2014-05-06 NOTE — ED Notes (Signed)
PA at bedside.

## 2014-05-06 NOTE — ED Provider Notes (Signed)
CSN: [id number]     Arrival date & time 05/06/14  0754 History   First MD Initiated Contact with Patient 05/06/14 [phone]     Chief Complaint  Patient presents with  . Numbness     (Consider location/radiation/quality/duration/timing/severity/associated sxs/prior Treatment) HPI  Stacey Fisher is a 39 y.o. 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  She states that she developed "cold" symptoms 4 days ago with nasal congestion and generalized fatigue associated with the numbness.  She describes tingling and decreased sensation from the level of left mid back and buttock that radiates around to just under her left breast down to the pelvis and into the left mid thigh.  She is concerned that her Guillain-Barre is returning.  She also reports some "tingling" to the anterior left lower leg but denies any symptoms on the right.  She also states that she had a brief episode of upper chest tightness on Friday that spontaneously resolved and denies further episodes.  She also denies shortness of breath, fever, chills, nausea or vomiting or significant cough.  She reports resolution of her Guillain-Barre symptoms after receiving IVIG.   Past Medical History  Diagnosis Date  . Guillain-Barre 06/2013    "presumed"  . Bad odor of urine 03/25/2014  . UTI (lower urinary tract infection) 03/25/2014  . Thrombocytopenia 06/2013   Past Surgical History  Procedure Laterality Date  . Sinus endo with fusion     Family History  Problem Relation Age of Onset  . Cancer Other     maternal great grandma-colon cancer  . Asthma Daughter   . Hyperlipidemia Maternal Grandmother   . Diabetes Paternal Grandmother   . Hypertension Paternal Grandmother   . Stroke Paternal Grandmother   . Asthma Daughter   . Other Father     Guillain-Barre   History  Substance Use Topics   . Smoking status: Never Smoker   . Smokeless tobacco: Never Used  . Alcohol Use: 3.0 oz/week    5 Glasses of wine per week     Comment: wine 5 times a week   OB History    Gravida Para Term Preterm AB TAB SAB Ectopic Multiple Living   4 2  2 2 1 1   2      Review of Systems  Constitutional: Negative for fever, chills and fatigue.  HENT: Positive for congestion. Negative for sore throat and trouble swallowing.   Respiratory: Negative for cough, shortness of breath and wheezing.   Cardiovascular: Negative for chest pain and palpitations.  Gastrointestinal: Negative for nausea, vomiting, abdominal pain and blood in stool.  Genitourinary: Negative for dysuria, hematuria and flank pain.  Musculoskeletal: Negative for myalgias, back pain, arthralgias, neck pain and neck stiffness.  Skin: Negative for rash.  Neurological: Positive for numbness (left sided trunk and thigh numbness). Negative for dizziness and weakness.  Hematological: Does not bruise/bleed easily.  Psychiatric/Behavioral: Negative for confusion.  All other systems reviewed and are negative.     Allergies  Review of patient's allergies indicates no known allergies.  Home Medications   Prior to Admission medications   Medication Sig Start Date End Date Taking? Authorizing Provider  nitrofurantoin, macrocrystal-monohydrate, (MACROBID) 100 MG capsule Take 1 capsule (100 mg total) by mouth 2 (two) times daily. 03/25/14   Adline Potter, NP   BP 122/76 mmHg  Pulse 67  Temp(Src) 99.1  F (37.3 C) (Oral)  Resp 16  Ht  (1.702 m)  Wt 182 lb (82.555 kg)  BMI 28.50 kg/m2  SpO2 100%  LMP 05/06/2014 Physical Exam  Constitutional: She is oriented to person, place, and time. She appears well-developed and well-nourished. No distress.  HENT:  Head: Normocephalic and atraumatic.  Mouth/Throat: Oropharynx is clear and moist.  Neck: Normal range of motion. Neck supple.  Cardiovascular: Normal rate, regular rhythm,  normal heart sounds and intact distal pulses.   No murmur heard. Pulmonary/Chest: Effort normal and breath sounds normal. No respiratory distress. She exhibits no tenderness.  Abdominal: Soft. She exhibits no distension. There is no tenderness. There is no rebound and no guarding.  Musculoskeletal: Normal range of motion. She exhibits no tenderness.  Lymphadenopathy:    She has no cervical adenopathy.  Neurological: She is alert and oriented to person, place, and time. She has normal strength. No cranial nerve deficit or sensory deficit. She exhibits normal muscle tone. Coordination normal. GCS eye subscore is 4. GCS verbal subscore is 5. GCS motor subscore is 6.  Reflex Scores:      Tricep reflexes are 2+ on the right side and 2+ on the left side.      Bicep reflexes are 2+ on the right side and 2+ on the left side.      Patellar reflexes are 2+ on the right side and 2+ on the left side.      Achilles reflexes are 2+ on the right side and 2+ on the left side. Skin: Skin is warm and dry.  Nursing note and vitals reviewed.   ED Course  Procedures (including critical care time) Labs Review Labs Reviewed  CBC WITH DIFFERENTIAL/PLATELET - Abnormal; Notable for the following:    Monocytes Relative 14 (*)    Eosinophils Relative 10 (*)    All other components within normal limits  BASIC METABOLIC PANEL - Abnormal; Notable for the following:    GFR calc non Af Amer 80 (*)    All other components within normal limits  URINALYSIS, ROUTINE W REFLEX MICROSCOPIC - Abnormal; Notable for the following:    Hgb urine dipstick LARGE (*)    Leukocytes, UA TRACE (*)    All other components within normal limits  URINE MICROSCOPIC-ADD ON - Abnormal; Notable for the following:    Squamous Epithelial / LPF MANY (*)    Bacteria, UA MANY (*)    All other components within normal limits  POC URINE PREG, ED    Imaging Review Dg Chest 2 View  05/06/2014   CLINICAL DATA:  LEFT-sided numbness from buttock  to mid rib area, chest tightness on Friday, history of Guillain-Barre  EXAM: CHEST  2 VIEW  COMPARISON:  None  FINDINGS: Normal heart size, mediastinal contours, and pulmonary vascularity.  Lungs well expanded and clear.  No pleural effusion or pneumothorax.  Bones unremarkable.  IMPRESSION: No acute abnormalities.   Electronically Signed   By: Ulyses Southward M.D.   On: 05/06/2014 09:20   Mr Laqueta Jean NW Contrast  05/06/2014   CLINICAL DATA:  Numbness bilateral lower extremities  EXAM: MRI HEAD WITHOUT AND WITH CONTRAST  TECHNIQUE: Multiplanar, multiecho pulse sequences of the brain and surrounding structures were obtained without and with intravenous contrast.  CONTRAST:  15mL MULTIHANCE GADOBENATE DIMEGLUMINE 529 MG/ML IV SOLN  COMPARISON:  Cervical MRI 05/06/2014  FINDINGS: Ventricle size is normal. Cerebral volume normal. Craniocervical junction demonstrates low lying cerebellar tonsils without Chiari malformation. Pituitary not  enlarged.  Multiple cerebral white matter lesions are present bilaterally. Many of these are small and periventricular in location, typical for demyelinating disease. There is also a lesion in the posterior limb internal capsule on the right. There is a small lesion in the splenium of the corpus callosum on the right. There are parietal periventricular white matter lesions and a right temporal periventricular white matter lesion. There is a small lesion in the right cerebellum measuring 5 mm which shows enhancement. No other enhancing lesions identified.  Possible small area of hyperintensity in the spinal cord on the left at C1 best seen on coronal FLAIR.  Diffusion-weighted imaging is negative. No acute infarct or restricted diffusion in the white matter lesions.  Negative for mass or edema.  Negative for intracranial hemorrhage  Mucosal thickening in the paranasal sinuses.  Mild enhancement of a small right cerebellar lesion as noted above. No other enhancing lesions identified.   IMPRESSION: Multiple small white matter lesions bilaterally. Many of these are periventricular in location and are characteristic of multiple sclerosis. Small enhancing lesion in the right cerebellum may be an area of acute demyelination.  Negative for acute infarct or mass.   Electronically Signed   By: Marlan Palau M.D.   On: 05/06/2014 11:48   Mr Cervical Spine W Wo Contrast  05/06/2014   CLINICAL DATA:  New diagnosis of Guillain-Barre. Bilateral lower extremity numbness.  EXAM: MRI CERVICAL SPINE WITHOUT AND WITH CONTRAST  TECHNIQUE: Multiplanar and multiecho pulse sequences of the cervical spine, to include the craniocervical junction and cervicothoracic junction, were obtained according to standard protocol without and with intravenous contrast.  CONTRAST:  15mL MULTIHANCE GADOBENATE DIMEGLUMINE 529 MG/ML IV SOLN  COMPARISON:  None.  FINDINGS: The patient has multiple scattered areas of abnormal increased signal intensity from the cervical spinal cord. Involved areas are scattered throughout the spinal cord, some being lateral, some anterior and some posterior.  There is no appreciable enhancement of these abnormalities after contrast administration.  The paraspinal soft tissues are normal.  C1-2 and C2-3:  Normal.  C3-4: Tiny disc bulge to the right of midline with no neural impingement.  C4-5:  Small disc protrusion into the left lateral recess.  C5-6: Minimal disc bulge and osteophytes extend to the left of midline with no neural impingement.  C6-7:  Small broad-based disc bulge with no neural impingement.  C7-T1:  Normal.  There is no cervical spinal stenosis, foraminal stenosis, or facet arthritis. There are no mass lesions. There is no spinal cord compression.  IMPRESSION: Multiple nonenhancing areas of white matter disease in the cervical spinal cord consistent with multiple sclerosis. The findings are not consistent with Guillain-Barre syndrome.   Electronically Signed   By: Geanie Cooley M.D.    On: 05/06/2014 11:46     EKG Interpretation None     Hematuria likely related to menses.   MDM   Final diagnoses:  Multiple sclerosis  Numbness   0915  Consulted Dr. Gerilyn Pilgrim.  recommends MRI of C spine and brain.  Patient resting comfortably, vitals remain stable.  Advised of lengthy wait for MRI and pt agreeable.  Requesting food   Consulted Dr. Gerilyn Pilgrim with MR results.  He requests medical admission and he will consult.  1 gm of solu-medrol ordered.  discussed pt with hospitalist, Dr. Rhona Leavens, will admit.     Bailee Thall L. Trisha Mangle, PA-C 05/06/14 1345  Samuel Jester, DO 05/07/14 [phone]

## 2014-05-06 NOTE — ED Notes (Signed)
Pt reports L sided numbness from her buttock to her mid rib area. Pt states on Fri when she woke up she had chest tightness, denies nausea or SOB. Pt states she has hx of [employer]. Pt reports difficulty with mvmt.

## 2014-05-07 ENCOUNTER — Observation Stay (HOSPITAL_COMMUNITY): Payer: Medicaid Other

## 2014-05-07 DIAGNOSIS — G35 Multiple sclerosis: Principal | ICD-10-CM

## 2014-05-07 DIAGNOSIS — R200 Anesthesia of skin: Secondary | ICD-10-CM | POA: Diagnosis present

## 2014-05-07 LAB — COMPREHENSIVE METABOLIC PANEL
ALBUMIN: 3.4 g/dL — AB (ref 3.5–5.2)
ALK PHOS: 53 U/L (ref 39–117)
ALT: 16 U/L (ref 0–35)
AST: 20 U/L (ref 0–37)
Anion gap: 4 — ABNORMAL LOW (ref 5–15)
BILIRUBIN TOTAL: 0.4 mg/dL (ref 0.3–1.2)
BUN: 11 mg/dL (ref 6–23)
CHLORIDE: 111 mmol/L (ref 96–112)
CO2: 23 mmol/L (ref 19–32)
Calcium: 8.7 mg/dL (ref 8.4–10.5)
Creatinine, Ser: 0.73 mg/dL (ref 0.50–1.10)
GFR calc non Af Amer: >90 mL/min (ref >90)
Glucose, Bld: 203 mg/dL — ABNORMAL HIGH (ref 70–99)
Potassium: 3.9 mmol/L (ref 3.5–5.1)
SODIUM: 138 mmol/L (ref 135–145)
Total Protein: 6.9 g/dL (ref 6.0–8.3)

## 2014-05-07 LAB — CBC
HCT: 39 % (ref 36.0–46.0)
HEMOGLOBIN: 12.7 g/dL (ref 12.0–15.0)
MCH: 29.3 pg (ref 26.0–34.0)
MCHC: 32.6 g/dL (ref 30.0–36.0)
MCV: 89.9 fL (ref 78.0–100.0)
PLATELETS: 177 10*3/uL (ref 150–400)
RBC: 4.34 MIL/uL (ref 3.87–5.11)
RDW: 13.5 % (ref 11.5–15.5)
WBC: 10.7 10*3/uL — ABNORMAL HIGH (ref 4.0–10.5)

## 2014-05-07 LAB — CSF CELL COUNT WITH DIFFERENTIAL
RBC Count, CSF: 0 /mm3
Tube #: 4
WBC, CSF: 9 /mm3 — ABNORMAL HIGH (ref 0–5)

## 2014-05-07 LAB — CRYPTOCOCCAL ANTIGEN, CSF: CRYPTO AG: NEGATIVE

## 2014-05-07 LAB — SEDIMENTATION RATE: Sed Rate: 3 mm/hr (ref 0–22)

## 2014-05-07 LAB — C-REACTIVE PROTEIN

## 2014-05-07 LAB — GLUCOSE, CSF: GLUCOSE CSF: 118 mg/dL — AB (ref 43–76)

## 2014-05-07 LAB — VITAMIN B12: Vitamin B-12: 628 pg/mL (ref 211–911)

## 2014-05-07 LAB — PROTEIN, CSF: Total  Protein, CSF: 29 mg/dL (ref 15–45)

## 2014-05-07 NOTE — Progress Notes (Signed)
Patient ID: Stacey Fisher, female   DOB: 07-Nov-1975, 39 y.o.   MRN: [id number]   Brandon A. [name], MD     [email or web address]          Stacey Fisher is an 39 y.o. female.   Assessment/Plan: 1. Acute/subacute numbness involving the left truncal region. The MRI findings, age and prior presentation makes the multiple sclerosis most likely etiology. 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 The patient will be initiated on high dose Solu-Medrol 1 g IV for 3-4 days. We will do a spinal tap obtaining the MS panel and also routine labs. Extensive additional labs will be obtained including ANA, sedimentation rate, C-reactive protein and other labs. Spinal fluid also be assessed for neurosarcoidosis. Also I may possibly consider titers for Sjogren's syndrome. The case is discussed quite lengthily with the patient. She will need to be placed on immune-modulating therapy at some point in time. Initial discussions were had regarding this but further discussion will need to be made at a later date.    She reports moderate improvement in the left-sided numbness. Spinal tap has been done and results are pending.  GENERAL: This is a pleasant female in no acute distress.  HEENT: Supple. Atraumatic normocephalic.   ABDOMEN: soft  EXTREMITIES: No edema   BACK: Normal.  SKIN: Normal by inspection.   MENTAL STATUS: Alert and oriented. Speech, language and cognition are generally intact. Judgment and insight normal.   CRANIAL NERVES: Pupils are equal, round and reactive to light and accommodation; extra ocular movements are full, there is no significant nystagmus; visual fields are full; upper and lower facial muscles are normal in strength and symmetric, there is no flattening of the nasolabial folds; tongue is midline; uvula is midline; shoulder elevation is  normal.  MOTOR: Normal tone, bulk and strength; no pronator drift.  COORDINATION: Left finger to nose is normal, right finger to nose is normal, No rest tremor; no intention tremor; no postural tremor; no bradykinesia.  REFLEXES: Deep tendon reflexes are symmetrical and normal. Babinski reflexes are flexor bilaterally.   SENSATION: Reduced sensation to temperature light touch involving the left thoracic and abdominal region extending from approximately L1 to about T5.     Objective: Vital signs in last 24 hours: Temp:  [98.1 F (36.7 C)-98.9 F (37.2 C)] 98.3 F (36.8 C) (01/27 1419) Pulse Rate:  [64-90] 65 (01/27 1419) Resp:  [16-18] 18 (01/27 1419) BP: (109-132)/(59-73) 118/59 mmHg (01/27 1419) SpO2:  [99 %-100 %] 100 % (01/27 1419)  Intake/Output from previous day: 01/26 0701 - 01/27 0700 In: 240 [P.O.:240] Out: -  Intake/Output this shift: Total I/O In: 720 [P.O.:720] Out: 200 [Urine:200] Nutritional status:     Lab Results: Results for orders placed or performed during the hospital encounter of 05/06/14 (from the past 48 hour(s))  Vitamin B12     Status: None   Collection Time: 05/06/14  5:00 AM  Result Value Ref Range   Vitamin B-12 628 211 - 911 pg/mL    Comment: Performed at [facility]  C-reactive protein     Status: Abnormal   Collection Time: 05/06/14  5:00 AM  Result Value Ref Range   CRP <0.5 (L) <0.60 mg/dL    Comment: Performed at [facility]  TSH     Status: None   Collection Time: 05/06/14  5:00 AM  Result Value Ref Range  TSH 0.573 0.350 - 4.500 uIU/mL  Sedimentation rate     Status: None   Collection Time: 05/06/14  5:00 AM  Result Value Ref Range   Sed Rate 3 0 - 22 mm/hr  Urinalysis, Routine w reflex microscopic     Status: Abnormal   Collection Time: 05/06/14  8:42 AM  Result Value Ref Range   Color, Urine YELLOW YELLOW   APPearance CLEAR CLEAR   Specific Gravity, Urine 1.015 1.005 - 1.030   pH 7.0 5.0 - 8.0    Glucose, UA NEGATIVE NEGATIVE mg/dL   Hgb urine dipstick LARGE (A) NEGATIVE   Bilirubin Urine NEGATIVE NEGATIVE   Ketones, ur NEGATIVE NEGATIVE mg/dL   Protein, ur NEGATIVE NEGATIVE mg/dL   Urobilinogen, UA 1.0 0.0 - 1.0 mg/dL   Nitrite NEGATIVE NEGATIVE   Leukocytes, UA TRACE (A) NEGATIVE  Urine microscopic-add on     Status: Abnormal   Collection Time: 05/06/14  8:42 AM  Result Value Ref Range   Squamous Epithelial / LPF MANY (A) RARE   RBC / HPF TOO NUMEROUS TO COUNT <3 RBC/hpf   Bacteria, UA MANY (A) RARE  POC Urine Pregnancy, ED (do NOT order at [facility])     Status: None   Collection Time: 05/06/14  8:44 AM  Result Value Ref Range   Preg Test, Ur NEGATIVE NEGATIVE    Comment:        THE SENSITIVITY OF THIS METHODOLOGY IS >24 mIU/mL   CBC with Differential     Status: Abnormal   Collection Time: 05/06/14  8:45 AM  Result Value Ref Range   WBC 4.3 4.0 - 10.5 K/uL   RBC 4.33 3.87 - 5.11 MIL/uL   Hemoglobin 12.7 12.0 - 15.0 g/dL   HCT 39.6 36.0 - 46.0 %   MCV 91.5 78.0 - 100.0 fL   MCH 29.3 26.0 - 34.0 pg   MCHC 32.1 30.0 - 36.0 g/dL   RDW 14.0 11.5 - 15.5 %   Platelets 154 150 - 400 K/uL   Neutrophils Relative % 43 43 - 77 %   Neutro Abs 1.9 1.7 - 7.7 K/uL   Lymphocytes Relative 32 12 - 46 %   Lymphs Abs 1.4 0.7 - 4.0 K/uL   Monocytes Relative 14 (H) 3 - 12 %   Monocytes Absolute 0.6 0.1 - 1.0 K/uL   Eosinophils Relative 10 (H) 0 - 5 %   Eosinophils Absolute 0.4 0.0 - 0.7 K/uL   Basophils Relative 1 0 - 1 %   Basophils Absolute 0.0 0.0 - 0.1 K/uL  Basic metabolic panel     Status: Abnormal   Collection Time: 05/06/14  8:45 AM  Result Value Ref Range   Sodium 139 135 - 145 mmol/L   Potassium 3.7 3.5 - 5.1 mmol/L   Chloride 109 96 - 112 mmol/L   CO2 28 19 - 32 mmol/L   Glucose, Bld 92 70 - 99 mg/dL   BUN 10 6 - 23 mg/dL   Creatinine, Ser 0.89 0.50 - 1.10 mg/dL   Calcium 8.5 8.4 - 10.5 mg/dL   GFR calc non Af Amer 80 (L) >90 mL/min   GFR calc Af Amer >90 >90 mL/min      Comment: (NOTE) The eGFR has been calculated using the CKD EPI equation. This calculation has not been validated in all clinical situations. eGFR's persistently <90 mL/min signify possible Chronic Kidney Disease.    Anion gap NOT CALCULATED 5 - 15  Comprehensive metabolic panel  Status: Abnormal   Collection Time: 05/07/14  6:02 AM  Result Value Ref Range   Sodium 138 135 - 145 mmol/L   Potassium 3.9 3.5 - 5.1 mmol/L   Chloride 111 96 - 112 mmol/L   CO2 23 19 - 32 mmol/L   Glucose, Bld 203 (H) 70 - 99 mg/dL   BUN 11 6 - 23 mg/dL   Creatinine, Ser 0.73 0.50 - 1.10 mg/dL   Calcium 8.7 8.4 - 10.5 mg/dL   Total Protein 6.9 6.0 - 8.3 g/dL   Albumin 3.4 (L) 3.5 - 5.2 g/dL   AST 20 0 - 37 U/L   ALT 16 0 - 35 U/L   Alkaline Phosphatase 53 39 - 117 U/L   Total Bilirubin 0.4 0.3 - 1.2 mg/dL   GFR calc non Af Amer >90 >90 mL/min   GFR calc Af Amer >90 >90 mL/min    Comment: (NOTE) The eGFR has been calculated using the CKD EPI equation. This calculation has not been validated in all clinical situations. eGFR's persistently <90 mL/min signify possible Chronic Kidney Disease.    Anion gap 4 (L) 5 - 15  CBC     Status: Abnormal   Collection Time: 05/07/14  6:02 AM  Result Value Ref Range   WBC 10.7 (H) 4.0 - 10.5 K/uL   RBC 4.34 3.87 - 5.11 MIL/uL   Hemoglobin 12.7 12.0 - 15.0 g/dL   HCT 39.0 36.0 - 46.0 %   MCV 89.9 78.0 - 100.0 fL   MCH 29.3 26.0 - 34.0 pg   MCHC 32.6 30.0 - 36.0 g/dL   RDW 13.5 11.5 - 15.5 %   Platelets 177 150 - 400 K/uL  Glucose, CSF     Status: Abnormal   Collection Time: 05/07/14 11:05 AM  Result Value Ref Range   Glucose, CSF 118 (H) 43 - 76 mg/dL  Protein, CSF     Status: None   Collection Time: 05/07/14 11:05 AM  Result Value Ref Range   Total  Protein, CSF 29 15 - 45 mg/dL  CSF cell count with differential     Status: Abnormal   Collection Time: 05/07/14 11:05 AM  Result Value Ref Range   Tube # 4    Color, CSF COLORLESS COLORLESS    Appearance, CSF CLEAR CLEAR   Supernatant NOT APPLICABLE    RBC Count, CSF 0 0 /cu mm   WBC, CSF 9 (H) 0 - 5 /cu mm   Segmented Neutrophils-CSF TOO FEW TO COUNT, SMEAR AVAILABLE FOR REVIEW 0 - 6 %   Lymphs, CSF FEW LYMPHS SEEN ON SCAN 40 - 80 %   Monocyte-Macrophage-Spinal Fluid TOO FEW TO COUNT, SMEAR AVAILABLE FOR REVIEW 15 - 45 %   Eosinophils, CSF TOO FEW TO COUNT, SMEAR AVAILABLE FOR REVIEW 0 - 1 %   Other Cells, CSF TOO FEW TO COUNT, SMEAR AVAILABLE FOR REVIEW     Lipid Panel No results for input(s): CHOL, TRIG, HDL, CHOLHDL, VLDL, LDLCALC in the last 72 hours.  Studies/Results:   Medications:  Scheduled Meds: . methylPREDNISolone (SOLU-MEDROL) injection  1,000 mg Intravenous Q24H   Continuous Infusions:  PRN Meds:.acetaminophen **OR** acetaminophen, morphine injection     LOS: 1 day   Supreme Rybarczyk A. [name], M.D.  Diplomate, Tax adviser of Psychiatry and Neurology ( Neurology).

## 2014-05-07 NOTE — Progress Notes (Signed)
OT Screen Note  Patient Details Name: Stacey Fisher MRN: [id number] DOB: 12/28/1975   Cancelled Treatment:    Reason Eval/Treat Not Completed: OT screened, no needs identified, will sign off. Patient seen by PT who reports that patient is independent with all functional mobility and ADL performance. Patient is at baseline. No DME needs.   Limmie Patricia, OTR/L,CBIS  [phone]  05/07/2014, 9:26 AM

## 2014-05-07 NOTE — Progress Notes (Signed)
[facility] PROGRESS NOTE  Stacey Fisher RUE:[id number] DOB: 1975-05-13 DOA: 05/06/2014 PCP: No PCP Per Patient  Assessment/Plan: 1. Acute/subacute numbness involving the left trunk region. Thoughts are this may be related to multiple sclerosis. Patient had MRI of her brain and C-spine that showed multiple small white matter lesions bilaterally as well as a small enhancing lesion in the right cerebellum which may be an area of acute demyelination. MRI C-spine indicated multiple nonenhancing areas of white matter disease in the cervical spinal cord consistent with multiple sclerosis. Patient was started on high-dose intravenous steroids. Neurology is following. Patient underwent lumbar puncture today with results to follow. Clinically, she is improving with intravenous steroids. Continue treatments.   Code Status: full code Family Communication: discussed with patient Disposition Plan: discharge home possibly in am   Consultants:  Neurology  Procedures:  Lumbar puncture done by radiology on 1/27  Antibiotics:    HPI/Subjective: Feels that the numbness on left side of abdomen is improving. No other complaints  Objective: Filed Vitals:   05/07/14 1419  BP: 118/59  Pulse: 65  Temp: 98.3 F (36.8 C)  Resp: 18    Intake/Output Summary (Last 24 hours) at 05/07/14 1757 Last data filed at 05/07/14 1300  Gross per 24 hour  Intake    960 ml  Output    200 ml  Net    760 ml   Filed Weights   05/06/14 0759  Weight: 82.555 kg (182 lb)    Exam:   General:  NAD  Cardiovascular: S1, S2 RRR  Respiratory: CTA B  Abdomen: soft, nt, nd, bs+  Musculoskeletal: no edema b/l   Data Reviewed: Basic Metabolic Panel:  Recent Labs Lab 05/06/14 0845 05/07/14 0602  NA 139 138  K 3.7 3.9  CL 109 111  CO2 28 23  GLUCOSE 92 203*  BUN 10 11  CREATININE 0.89 0.73  CALCIUM 8.5 8.7   Liver Function Tests:  Recent Labs Lab 05/07/14 0602  AST 20  ALT 16  ALKPHOS 53   BILITOT 0.4  PROT 6.9  ALBUMIN 3.4*   No results for input(s): LIPASE, AMYLASE in the last 168 hours. No results for input(s): AMMONIA in the last 168 hours. CBC:  Recent Labs Lab 05/06/14 0845 05/07/14 0602  WBC 4.3 10.7*  NEUTROABS 1.9  --   HGB 12.7 12.7  HCT 39.6 39.0  MCV 91.5 89.9  PLT 154 177   Cardiac Enzymes: No results for input(s): CKTOTAL, CKMB, CKMBINDEX, TROPONINI in the last 168 hours. BNP (last 3 results) No results for input(s): PROBNP in the last 8760 hours. CBG: No results for input(s): GLUCAP in the last 168 hours.  No results found for this or any previous visit (from the past 240 hour(s)).   Studies: Dg Chest 2 View  05/06/2014   CLINICAL DATA:  LEFT-sided numbness from buttock to mid rib area, chest tightness on Friday, history of Guillain-Barre  EXAM: CHEST  2 VIEW  COMPARISON:  None  FINDINGS: Normal heart size, mediastinal contours, and pulmonary vascularity.  Lungs well expanded and clear.  No pleural effusion or pneumothorax.  Bones unremarkable.  IMPRESSION: No acute abnormalities.   Electronically Signed   By: Ulyses Southward M.D.   On: 05/06/2014 09:20   Mr Laqueta Jean JY Contrast  05/06/2014   CLINICAL DATA:  Numbness bilateral lower extremities  EXAM: MRI HEAD WITHOUT AND WITH CONTRAST  TECHNIQUE: Multiplanar, multiecho pulse sequences of the brain and surrounding structures were obtained without and  with intravenous contrast.  CONTRAST:  103mL MULTIHANCE GADOBENATE DIMEGLUMINE 529 MG/ML IV SOLN  COMPARISON:  Cervical MRI 05/06/2014  FINDINGS: Ventricle size is normal. Cerebral volume normal. Craniocervical junction demonstrates low lying cerebellar tonsils without Chiari malformation. Pituitary not enlarged.  Multiple cerebral white matter lesions are present bilaterally. Many of these are small and periventricular in location, typical for demyelinating disease. There is also a lesion in the posterior limb internal capsule on the right. There is a small  lesion in the splenium of the corpus callosum on the right. There are parietal periventricular white matter lesions and a right temporal periventricular white matter lesion. There is a small lesion in the right cerebellum measuring 5 mm which shows enhancement. No other enhancing lesions identified.  Possible small area of hyperintensity in the spinal cord on the left at C1 best seen on coronal FLAIR.  Diffusion-weighted imaging is negative. No acute infarct or restricted diffusion in the white matter lesions.  Negative for mass or edema.  Negative for intracranial hemorrhage  Mucosal thickening in the paranasal sinuses.  Mild enhancement of a small right cerebellar lesion as noted above. No other enhancing lesions identified.  IMPRESSION: Multiple small white matter lesions bilaterally. Many of these are periventricular in location and are characteristic of multiple sclerosis. Small enhancing lesion in the right cerebellum may be an area of acute demyelination.  Negative for acute infarct or mass.   Electronically Signed   By: Marlan Palau M.D.   On: 05/06/2014 11:48   Mr Cervical Spine W Wo Contrast  05/06/2014   CLINICAL DATA:  New diagnosis of Guillain-Barre. Bilateral lower extremity numbness.  EXAM: MRI CERVICAL SPINE WITHOUT AND WITH CONTRAST  TECHNIQUE: Multiplanar and multiecho pulse sequences of the cervical spine, to include the craniocervical junction and cervicothoracic junction, were obtained according to standard protocol without and with intravenous contrast.  CONTRAST:  38mL MULTIHANCE GADOBENATE DIMEGLUMINE 529 MG/ML IV SOLN  COMPARISON:  None.  FINDINGS: The patient has multiple scattered areas of abnormal increased signal intensity from the cervical spinal cord. Involved areas are scattered throughout the spinal cord, some being lateral, some anterior and some posterior.  There is no appreciable enhancement of these abnormalities after contrast administration.  The paraspinal soft tissues  are normal.  C1-2 and C2-3:  Normal.  C3-4: Tiny disc bulge to the right of midline with no neural impingement.  C4-5:  Small disc protrusion into the left lateral recess.  C5-6: Minimal disc bulge and osteophytes extend to the left of midline with no neural impingement.  C6-7:  Small broad-based disc bulge with no neural impingement.  C7-T1:  Normal.  There is no cervical spinal stenosis, foraminal stenosis, or facet arthritis. There are no mass lesions. There is no spinal cord compression.  IMPRESSION: Multiple nonenhancing areas of white matter disease in the cervical spinal cord consistent with multiple sclerosis. The findings are not consistent with Guillain-Barre syndrome.   Electronically Signed   By: Geanie Cooley M.D.   On: 05/06/2014 11:46   Dg Fluoro Guide Lumbar Puncture  05/07/2014   CLINICAL DATA:  New diagnosis of multiple sclerosis  EXAM: DIAGNOSTIC LUMBAR PUNCTURE UNDER FLUOROSCOPIC GUIDANCE  FLUOROSCOPY TIME:  Dictate in minutes and seconds  PROCEDURE: Procedure, benefits, and risks were discussed with the patient, including alternatives.  Patient's questions were answered.  Written informed consent was obtained.  Timeout protocol followed.  Patient placed prone.  L4-L5 disc space was localized under fluoroscopy.  Skin prepped and draped in usual  sterile fashion.  Skin and soft tissues anesthetized with 2 mL of 1% lidocaine lidocaine.  22 gauge needle was advanced into the spinal canal where clear colorless CSF was encountered with an opening pressure of 8.5 cm H2O (prone).  10.5 ml of CSF was obtained in 4 tubes for requested analysis.  Procedure tolerated very well by patient without immediate complication.  IMPRESSION: Fluoroscopic guided lumbar puncture as above.   Electronically Signed   By: Ulyses Southward M.D.   On: 05/07/2014 12:11    Scheduled Meds: . methylPREDNISolone (SOLU-MEDROL) injection  1,000 mg Intravenous Q24H   Continuous Infusions:   Principal Problem:   Multiple  sclerosis Active Problems:   Numbness   Weakness    Time spent:    Myley Bahner  [facility] Pager [phone]. If 7PM-7AM, please contact night-coverage at [email or web address], password [facility] 05/07/2014, 5:57 PM  LOS: 1 day

## 2014-05-07 NOTE — Procedures (Signed)
Preprocedure Dx: Multiple sclerosis Postprocedure Dx: Multiple sclerosis Procedure:  Fluoroscopically guided lumbar puncture Radiologist:  Tyron Russell Anesthesia:  2 ml of 1% lidocaine Specimen:  10.5 ml CSF, clear colorless EBL:   None Opening pressure: 8.5 cm H2O (prone) Complications: None

## 2014-05-07 NOTE — Evaluation (Signed)
Physical Therapy Evaluation Patient Details Name: Stacey Fisher MRN: [id number] DOB: [birth year] Today's Date: 05/07/2014   History of Present Illness  Pt is a 39 year old female With no significant past medical history who presented to the ED with worsening descending weakness that started recently. 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 In the Ed, Neurology was initially consulted, who recommended MRI of the brain and c spine. Imaging demonstrated multiple small white matter lesions bilaterally suggestive of MS. Neurology recommended medical admission. Pt was started on IV steroids and hospitalist consulted for admission.  Clinical Impression  Pt is a 39 year old female who presents to PT pending confirmation of dx of MS.  During evaluation, pt was (I) with bed mobility skills, transfers, gait, and stair climbing.  Discussion held regarding dx of MS and recommendations for functional mobility skills (regular exercise program including cardio, strength, and balance and energy conservation techniques when exacerbated condition), and possible need for PT services in the future if needed.  Pt is at baseline level of function and will not need further PT services at this time.  No DME recommendations.     Follow Up Recommendations No PT follow up    Equipment Recommendations  None recommended by PT       Precautions / Restrictions Precautions Precautions: Fall      Mobility  Bed Mobility Overal bed mobility: Independent                Transfers Overall transfer level: Independent                  Ambulation/Gait Ambulation/Gait assistance: Independent Ambulation Distance (Feet): 450 Feet Assistive device: None Gait Pattern/deviations: Step-through pattern   Gait velocity interpretation: at or above normal speed for age/gender    Stairs Stairs: Yes Stairs assistance: Independent Stair Management: One rail Right Number of Stairs: 10        Balance Overall balance assessment: Independent                                           Pertinent Vitals/Pain Pain Assessment: No/denies pain    Home Living Family/patient expects to be discharged to:: Private residence Living Arrangements: Children;Non-relatives/Friends Available Help at Discharge: Family;Available PRN/intermittently Type of Home: House Home Access: Stairs to enter Entrance Stairs-Rails: Can reach both Entrance Stairs-Number of Steps: 8 Home Layout: One level Home Equipment: None Additional Comments: Walk In Shower    Prior Function Level of Independence: Independent         Comments: Pt (I) with ADLS, IADLs.  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      Hand Dominance        Extremity/Trunk Assessment   Upper Extremity Assessment: Defer to OT evaluation           Lower Extremity Assessment: Overall WFL for tasks assessed         Communication   Communication: No difficulties  Cognition Arousal/Alertness: Awake/alert Behavior During Therapy: WFL for tasks assessed/performed Overall Cognitive Status: Within Functional Limits for tasks assessed                        Assessment/Plan    PT Assessment Patent does not need any further PT services  PT Diagnosis     PT Problem  List    PT Treatment Interventions     PT Goals (Current goals can be found in the Care Plan section) Acute Rehab PT Goals PT Goal Formulation: All assessment and education complete, DC therapy     End of Session Equipment Utilized During Treatment: Gait belt Activity Tolerance: Patient tolerated treatment well Patient left: in bed;with call bell/phone within reach      Functional Assessment Tool Used: Clinical Judgement Functional Limitation: Mobility: Walking and moving around Mobility: Walking and Moving Around Current Status [phone]): 0 percent impaired, limited or restricted Mobility:  Walking and Moving Around Goal Status [phone]): 0 percent impaired, limited or restricted Mobility: Walking and Moving Around Discharge Status [phone]): 0 percent impaired, limited or restricted    Time: 0815-0825 PT Time Calculation (min) (ACUTE ONLY): 10 min   Charges:   PT Evaluation $Initial PT Evaluation Tier I: 1 Procedure     PT G Codes:   PT G-Codes **NOT FOR INPATIENT CLASS** Functional Assessment Tool Used: Clinical Judgement Functional Limitation: Mobility: Walking and moving around Mobility: Walking and Moving Around Current Status (B1478): 0 percent impaired, limited or restricted Mobility: Walking and Moving Around Goal Status (G9562): 0 percent impaired, limited or restricted Mobility: Walking and Moving Around Discharge Status (Z3086): 0 percent impaired, limited or restricted    Kellie Shropshire, DPT [phone]  05/07/2014, 8:47 AM

## 2014-05-07 NOTE — Progress Notes (Signed)
Inpatient Diabetes Program Recommendations  AACE/ADA: New Consensus Statement on Inpatient Glycemic Control (2013)  Target Ranges:  Prepandial:   less than 140 mg/dL      Peak postprandial:   less than 180 mg/dL (1-2 hours)      Critically ill patients:  140 - 180 mg/dL   Results for Stacey Fisher, Stacey Fisher (MRN [id number]) as of 05/07/2014 11:07  Ref. Range 05/06/2014 08:45 05/07/2014 06:02  Glucose Latest Range: 70-99 mg/dL 92 552 (H)   Diabetes history: No Outpatient Diabetes medications: NA Current orders for Inpatient glycemic control: None  Inpatient Diabetes Program Recommendations Correction (SSI): Patient does not have a documented history of diabetes. Hyperglycemia likely due to steroids. While inpatient and ordered steroids, may want to consider ordering CBGs with Novolog correction scale. HgbA1C: May want to consider ordering an A1C to evaluate glycemic control over the past 2-3 months.  Thanks, Orlando Penner, RN, MSN, CCRN, CDE Diabetes Coordinator Inpatient Diabetes Program [phone] (Team Pager) [phone] (AP office) [phone] [facility] office)

## 2014-05-07 NOTE — Progress Notes (Signed)
UR completed 

## 2014-05-08 LAB — ANA: ANA: NEGATIVE

## 2014-05-08 NOTE — Progress Notes (Signed)
Patient given dischrage instructions and will follow up with neurology.  No complaints of pain and vitals stable at this time.

## 2014-05-08 NOTE — Discharge Summary (Signed)
Physician Discharge Summary  Stacey Fisher:[id number] DOB: October 21, 1975 DOA: 05/06/2014  PCP: No PCP Per Patient  Admit date: 05/06/2014 Discharge date: 05/08/2014  Time spent: 25 minutes  Recommendations for Outpatient Follow-up:  1. Follow up with neurology  Discharge Diagnoses:  Principal Problem:   Multiple sclerosis Active Problems:   Numbness   Weakness   Discharge Condition: improved  Diet recommendation: low salt  Filed Weights   05/06/14 0759  Weight: 82.555 kg (182 lb)    History of present illness and hospital course:  This is a 39 year old female who presents to the hospital with complaints of left abdomen/lower back numbness. 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 Neurology was consulted in the emergency room who recommended MRI of the brain and C-spine. These studies were done and demonstrated multiple small white matter lesions bilaterally suggestive of multiple sclerosis. Neurology recommended medical admissions. She was started on high-dose intravenous steroids and admitted for further treatment. Patient underwent lumbar puncture for further diagnosis of multiple sclerosis. These labs are currently still in process. It was felt that she likely has an underlying diagnosis of MS and in fact her previous diagnosis of Guillain-Barr syndrome may have been multiple sclerosis flare. She was treated with high-dose steroids and had significant improvement in her symptoms. Her numbness has now resolved. She has been cleared for discharge by neurology and will follow-up in the outpatient setting to follow-up results of lumbar puncture and to discuss further treatment options.   Procedures:    Consultations:  Neurology  Discharge Exam: Filed Vitals:   05/08/14 1407  BP: 116/61  Pulse: 74  Temp: 98.5 F (36.9 C)  Resp: 18    General: NAD Cardiovascular: S1, S2 RRR Respiratory: CTA B  Discharge Instructions   Discharge  Instructions    Call MD for:  severe uncontrolled pain    Complete by:  As directed      Diet - low sodium heart healthy    Complete by:  As directed      Increase activity slowly    Complete by:  As directed           Discharge Medication List as of 05/08/2014  4:55 PM    STOP taking these medications     nitrofurantoin, macrocrystal-monohydrate, (MACROBID) 100 MG capsule        No Known Allergies    The results of significant diagnostics from this hospitalization (including imaging, microbiology, ancillary and laboratory) are listed below for reference.    Significant Diagnostic Studies: Dg Chest 2 View  05/06/2014   CLINICAL DATA:  LEFT-sided numbness from buttock to mid rib area, chest tightness on Friday, history of Guillain-Barre  EXAM: CHEST  2 VIEW  COMPARISON:  None  FINDINGS: Normal heart size, mediastinal contours, and pulmonary vascularity.  Lungs well expanded and clear.  No pleural effusion or pneumothorax.  Bones unremarkable.  IMPRESSION: No acute abnormalities.   Electronically Signed   By: Ulyses Southward M.D.   On: 05/06/2014 09:20   Mr Laqueta Jean WJ Contrast  05/06/2014   CLINICAL DATA:  Numbness bilateral lower extremities  EXAM: MRI HEAD WITHOUT AND WITH CONTRAST  TECHNIQUE: Multiplanar, multiecho pulse sequences of the brain and surrounding structures were obtained without and with intravenous contrast.  CONTRAST:  15mL MULTIHANCE GADOBENATE DIMEGLUMINE 529 MG/ML IV SOLN  COMPARISON:  Cervical MRI 05/06/2014  FINDINGS: Ventricle size is normal. Cerebral volume normal. Craniocervical junction demonstrates low lying cerebellar tonsils without Chiari malformation. Pituitary  not enlarged.  Multiple cerebral white matter lesions are present bilaterally. Many of these are small and periventricular in location, typical for demyelinating disease. There is also a lesion in the posterior limb internal capsule on the right. There is a small lesion in the splenium of the corpus  callosum on the right. There are parietal periventricular white matter lesions and a right temporal periventricular white matter lesion. There is a small lesion in the right cerebellum measuring 5 mm which shows enhancement. No other enhancing lesions identified.  Possible small area of hyperintensity in the spinal cord on the left at C1 best seen on coronal FLAIR.  Diffusion-weighted imaging is negative. No acute infarct or restricted diffusion in the white matter lesions.  Negative for mass or edema.  Negative for intracranial hemorrhage  Mucosal thickening in the paranasal sinuses.  Mild enhancement of a small right cerebellar lesion as noted above. No other enhancing lesions identified.  IMPRESSION: Multiple small white matter lesions bilaterally. Many of these are periventricular in location and are characteristic of multiple sclerosis. Small enhancing lesion in the right cerebellum may be an area of acute demyelination.  Negative for acute infarct or mass.   Electronically Signed   By: Marlan Palau M.D.   On: 05/06/2014 11:48   Mr Cervical Spine W Wo Contrast  05/06/2014   CLINICAL DATA:  New diagnosis of Guillain-Barre. Bilateral lower extremity numbness.  EXAM: MRI CERVICAL SPINE WITHOUT AND WITH CONTRAST  TECHNIQUE: Multiplanar and multiecho pulse sequences of the cervical spine, to include the craniocervical junction and cervicothoracic junction, were obtained according to standard protocol without and with intravenous contrast.  CONTRAST:  15mL MULTIHANCE GADOBENATE DIMEGLUMINE 529 MG/ML IV SOLN  COMPARISON:  None.  FINDINGS: The patient has multiple scattered areas of abnormal increased signal intensity from the cervical spinal cord. Involved areas are scattered throughout the spinal cord, some being lateral, some anterior and some posterior.  There is no appreciable enhancement of these abnormalities after contrast administration.  The paraspinal soft tissues are normal.  C1-2 and C2-3:  Normal.   C3-4: Tiny disc bulge to the right of midline with no neural impingement.  C4-5:  Small disc protrusion into the left lateral recess.  C5-6: Minimal disc bulge and osteophytes extend to the left of midline with no neural impingement.  C6-7:  Small broad-based disc bulge with no neural impingement.  C7-T1:  Normal.  There is no cervical spinal stenosis, foraminal stenosis, or facet arthritis. There are no mass lesions. There is no spinal cord compression.  IMPRESSION: Multiple nonenhancing areas of white matter disease in the cervical spinal cord consistent with multiple sclerosis. The findings are not consistent with Guillain-Barre syndrome.   Electronically Signed   By: Geanie Cooley M.D.   On: 05/06/2014 11:46   Mm Digital Screening Bilateral  05/01/2014   CLINICAL DATA:  Screening. Baseline study.  EXAM: DIGITAL SCREENING BILATERAL MAMMOGRAM WITH CAD  COMPARISON:  None.  ACR Breast Density Category choose 3.  FINDINGS: In the left breast, a possible mass and asymmetry warrant further evaluation with spot compression views and possibly ultrasound. In the right breast, no findings suspicious for malignancy.  Images were processed with CAD.  IMPRESSION: Further evaluation is suggested for possible mass and asymmetry in the left breast.  RECOMMENDATION: Diagnostic mammogram and possibly ultrasound of the left breast. (Code:[id number])  The patient will be contacted regarding the findings, and additional imaging will be scheduled.  BI-RADS CATEGORY  0: Incomplete. Need additional imaging evaluation  and/or prior mammograms for comparison.   Electronically Signed   By: Raymondo Band   On: 05/01/2014 14:10   Dg Fluoro Guide Lumbar Puncture  05/07/2014   CLINICAL DATA:  New diagnosis of multiple sclerosis  EXAM: DIAGNOSTIC LUMBAR PUNCTURE UNDER FLUOROSCOPIC GUIDANCE  FLUOROSCOPY TIME:  Dictate in minutes and seconds  PROCEDURE: Procedure, benefits, and risks were discussed with the patient, including alternatives.   Patient's questions were answered.  Written informed consent was obtained.  Timeout protocol followed.  Patient placed prone.  L4-L5 disc space was localized under fluoroscopy.  Skin prepped and draped in usual sterile fashion.  Skin and soft tissues anesthetized with 2 mL of 1% lidocaine lidocaine.  22 gauge needle was advanced into the spinal canal where clear colorless CSF was encountered with an opening pressure of 8.5 cm H2O (prone).  10.5 ml of CSF was obtained in 4 tubes for requested analysis.  Procedure tolerated very well by patient without immediate complication.  IMPRESSION: Fluoroscopic guided lumbar puncture as above.   Electronically Signed   By: Ulyses Southward M.D.   On: 05/07/2014 12:11    Microbiology: No results found for this or any previous visit (from the past 240 hour(s)).   Labs: Basic Metabolic Panel:  Recent Labs Lab 05/06/14 0845 05/07/14 0602  NA 139 138  K 3.7 3.9  CL 109 111  CO2 28 23  GLUCOSE 92 203*  BUN 10 11  CREATININE 0.89 0.73  CALCIUM 8.5 8.7   Liver Function Tests:  Recent Labs Lab 05/07/14 0602  AST 20  ALT 16  ALKPHOS 53  BILITOT 0.4  PROT 6.9  ALBUMIN 3.4*   No results for input(s): LIPASE, AMYLASE in the last 168 hours. No results for input(s): AMMONIA in the last 168 hours. CBC:  Recent Labs Lab 05/06/14 0845 05/07/14 0602  WBC 4.3 10.7*  NEUTROABS 1.9  --   HGB 12.7 12.7  HCT 39.6 39.0  MCV 91.5 89.9  PLT 154 177   Cardiac Enzymes: No results for input(s): CKTOTAL, CKMB, CKMBINDEX, TROPONINI in the last 168 hours. BNP: BNP (last 3 results) No results for input(s): PROBNP in the last 8760 hours. CBG: No results for input(s): GLUCAP in the last 168 hours.     Signed:  Makylee Sanborn  [facility] 05/08/2014, 7:02 PM

## 2014-05-09 LAB — SJOGRENS SYNDROME-B EXTRACTABLE NUCLEAR ANTIBODY: SSB (LA) (ENA) ANTIBODY, IGG: NEGATIVE

## 2014-05-09 LAB — ANGIOTENSIN CONVERTING ENZYME, CSF: ANGIO CONVERT ENZYME: 6 U/L (ref <=15)

## 2014-05-09 LAB — SJOGRENS SYNDROME-A EXTRACTABLE NUCLEAR ANTIBODY: SSA (Ro) (ENA) Antibody, IgG: <1

## 2014-05-10 LAB — HIV ANTIBODY (ROUTINE TESTING W REFLEX): HIV Screen 4th Generation wRfx: NONREACTIVE

## 2014-05-10 LAB — RPR: RPR Ser Ql: NONREACTIVE

## 2014-05-13 ENCOUNTER — Encounter (HOSPITAL_COMMUNITY): Payer: 59

## 2014-05-13 LAB — HOMOCYSTEINE: HOMOCYSTEINE-NORM: 3.4 umol/L (ref 0.0–15.0)

## 2014-05-14 LAB — MULTIPLE SCLEROSIS PANEL 2
Albumin CSF: 17 mg/dL (ref <35)
Albumin Index: 4.4 (ref <9.0)
Albumin: 3860 mg/dL (ref 3700–5410)
CNS-IgG Synthesis Rate: 20.3 mg/24hr — ABNORMAL HIGH (ref <3.3)
IGG MSPROF: 3.84 mg/dL — AB (ref <0.10)
IGG TOTAL CSF: 7.9 mg/dL — AB (ref 0.5–6.1)
IgA CSF: 0.29 mg/dL (ref 0.15–0.60)
IgA MSPROF: <0.001 mg/dL (ref <0.010)
IgA Total: 272 mg/dL (ref 81–463)
IgG Total: 1403 mg/dL (ref 694–1618)
IgG-Index: 1.28 — ABNORMAL HIGH (ref <0.70)
IgM MSPROF: <0.001 mg/dL (ref <0.010)
IgM Total: 200 mg/dL (ref 48–271)
IgM-CSF: 0.07 mg/dL (ref <0.10)
Myelin basic protein, csf: <2 mcg/L (ref <4.1)

## 2014-05-27 ENCOUNTER — Other Ambulatory Visit: Payer: Self-pay | Admitting: Adult Health

## 2014-05-27 ENCOUNTER — Ambulatory Visit (HOSPITAL_COMMUNITY)
Admission: RE | Admit: 2014-05-27 | Discharge: 2014-05-27 | Disposition: A | Discharge status: Home / Self Care | Payer: Medicaid Other | Source: Ambulatory Visit | Attending: Adult Health | Admitting: Adult Health

## 2014-05-27 DIAGNOSIS — N6320 Unspecified lump in the left breast, unspecified quadrant: Secondary | ICD-10-CM

## 2014-05-27 DIAGNOSIS — R928 Other abnormal and inconclusive findings on diagnostic imaging of breast: Secondary | ICD-10-CM | POA: Diagnosis present

## 2014-06-10 ENCOUNTER — Ambulatory Visit (HOSPITAL_COMMUNITY): Admission: RE | Admit: 2014-06-10 | Payer: Medicaid Other | Source: Ambulatory Visit

## 2014-07-17 ENCOUNTER — Telehealth: Payer: Self-pay | Admitting: Adult Health

## 2014-07-18 NOTE — Telephone Encounter (Signed)
Call [facility] center, did not get a Olegario Messier to phone left message they could call me back

## 2015-10-04 IMAGING — US US BREAST LTD UNI LEFT INC AXILLA
1 series · 13 of 16 positions shown · non-contrast
Comparison: Screening mammogram dated 04/28/2014

CLINICAL DATA: Screening recall for a possible mass in the outer
left breast.

EXAM:
DIGITAL DIAGNOSTIC LEFT MAMMOGRAM
ULTRASOUND LEFT BREAST

[Series 1: us breast ltd uni left inc axilla · 0.07mm/px · 13 of 16 slices shown]
[im 1/16]
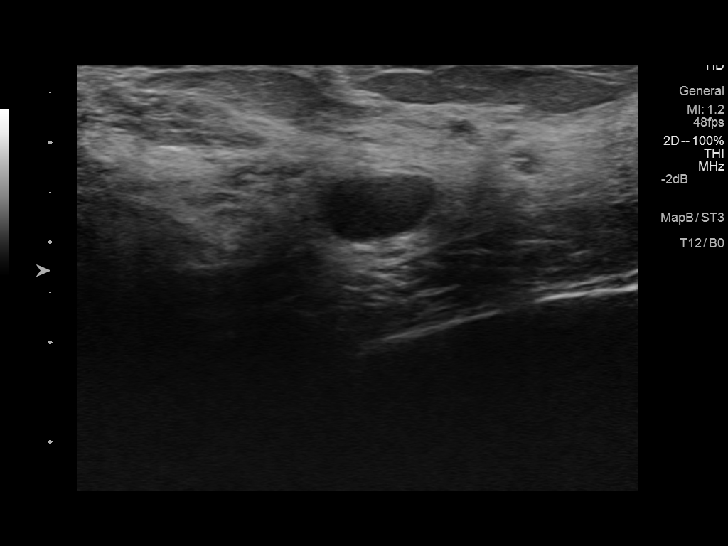
[im 2/16]
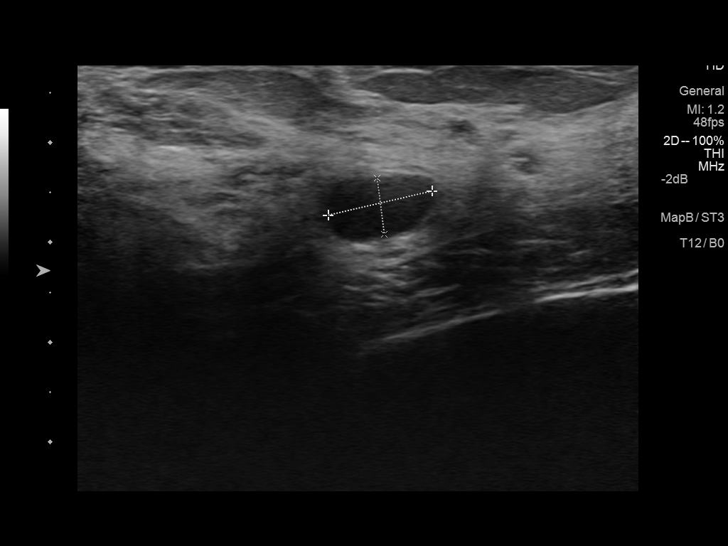
[im 4/16]
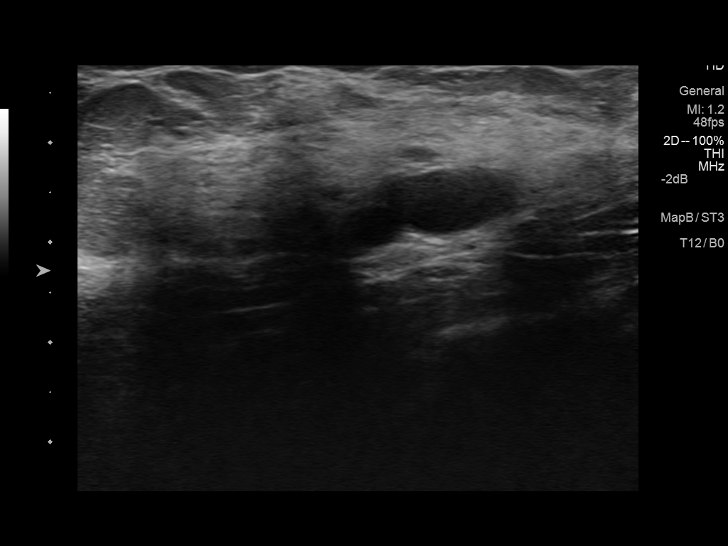
[im 5/16]
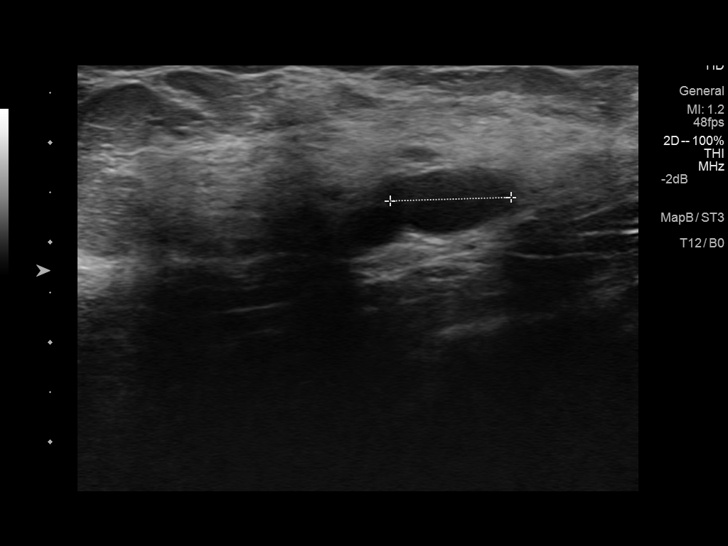
[im 6/16]
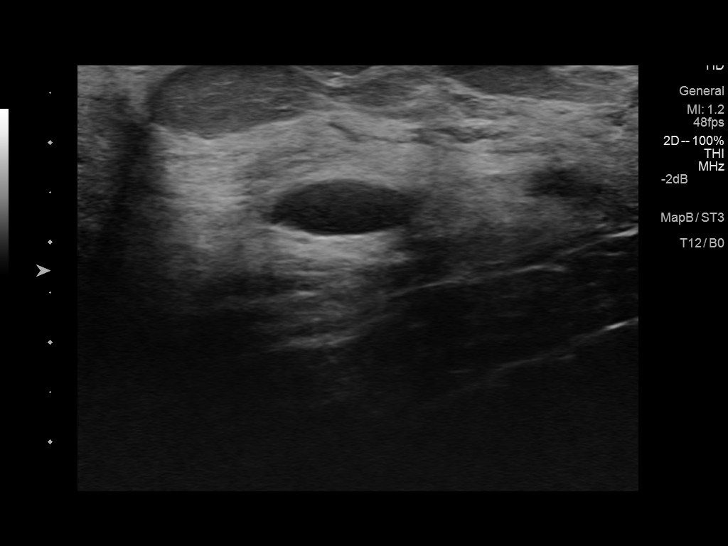
[im 7/16]
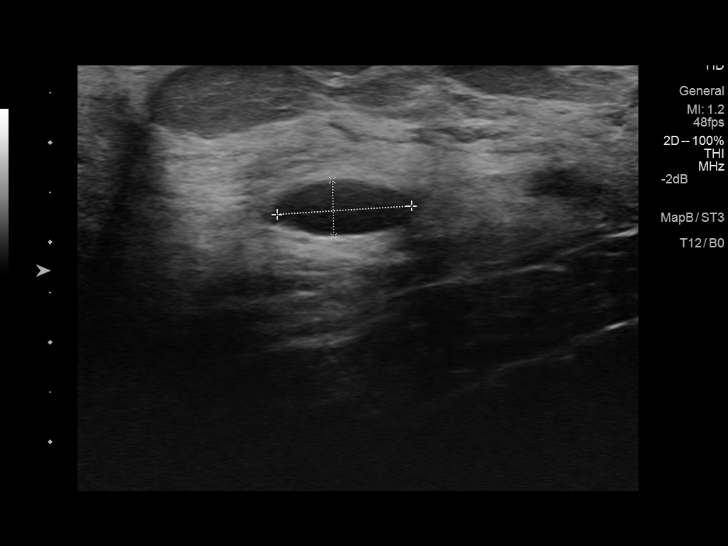
[im 9/16]
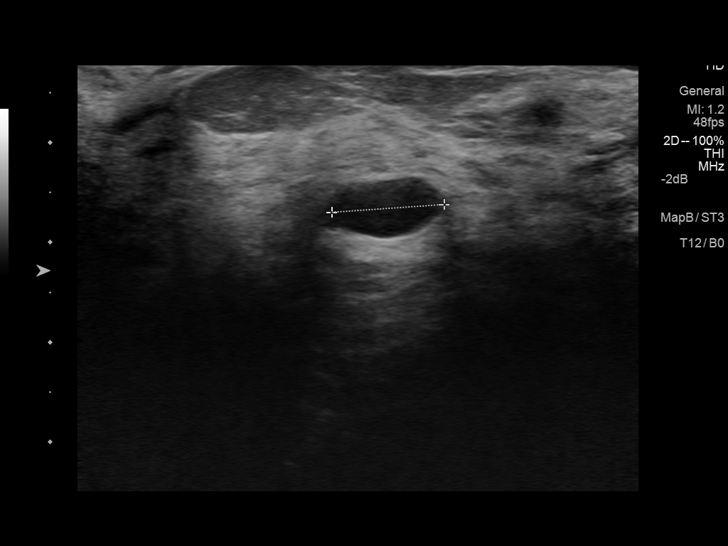
[im 10/16]
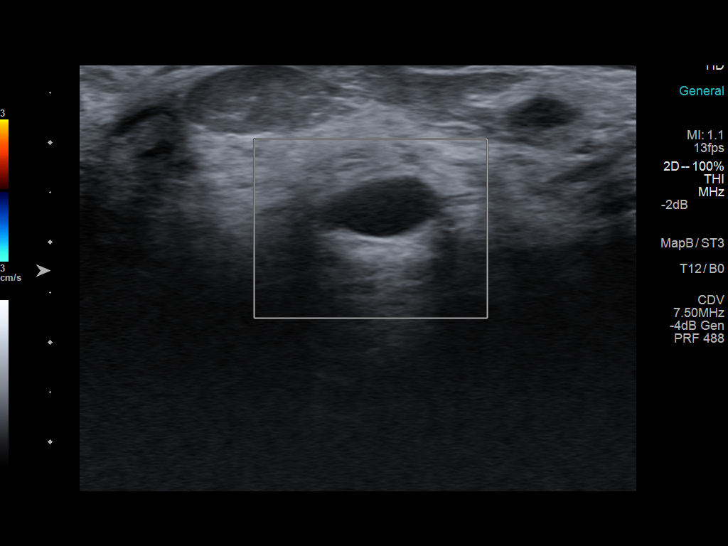
[im 11/16]
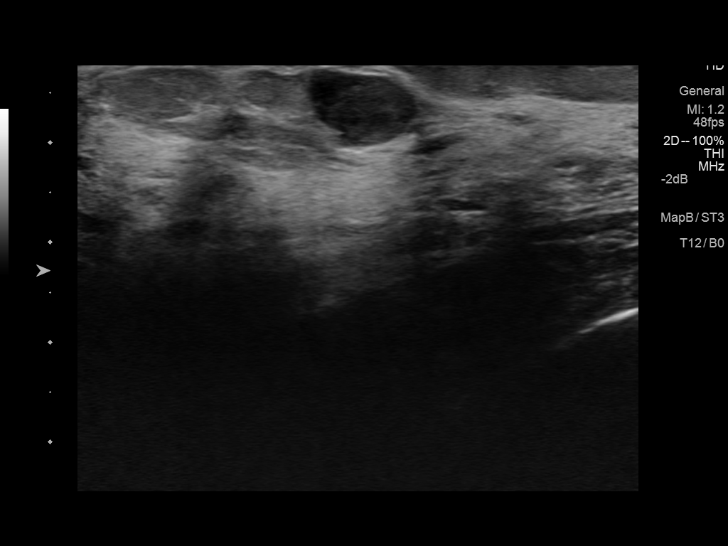
[im 12/16]
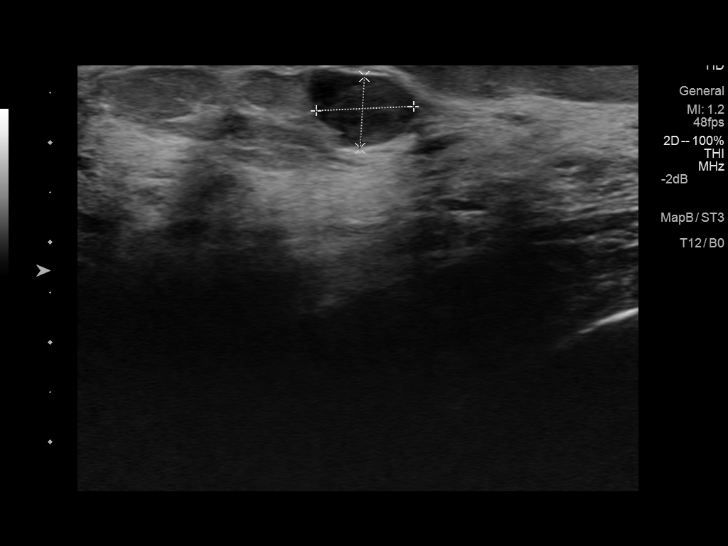
[im 13/16]
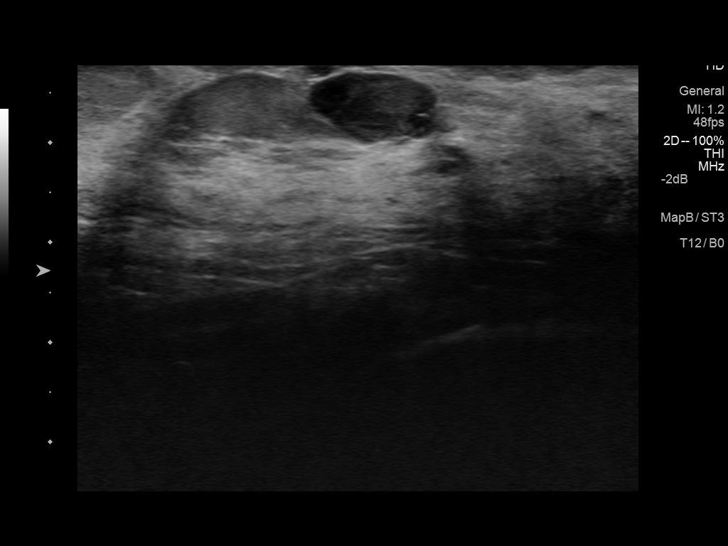
[im 15/16]
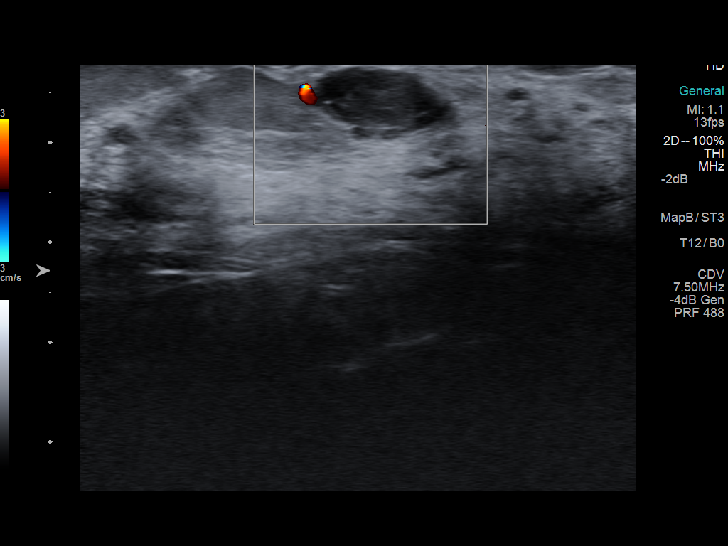
[im 16/16]
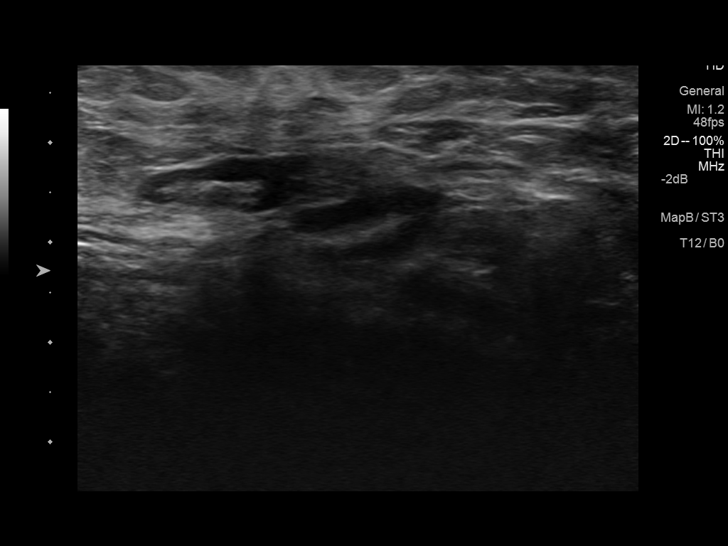

[13 of 16 positions shown; findings below may reference images not displayed]

ACR Breast Density Category c: The breast tissue is heterogeneously
dense, which may obscure small masses.
FINDINGS: Spot compression CC and MLO views were performed over the outer left
breast demonstrating an oval circumscribed mass measuring 1.6 cm
best seen on the CC view and less apparent on the true lateral view.
An additional asymmetry seen in the lower posterior left breast on
the MLO view resolves on the spot compression MLO view compatible
with overlapping tissue.

Physical examination of the outer left breast reveals generalized
nodularity.

Targeted ultrasound of the left breast was performed demonstrating
innumerable cysts in the outer left breast, many of which are
complicated, from the approximate 2 to 4 o'clock position. A
representative cyst at 2 o'clock 4 cm from the nipple measures 1.1 x
0.6 x 1.2 cm and an additional cyst at 3 o'clock 4 cm from the
nipple measures 1.4 x 0.6 x 1.1 cm. There is a cyst in the
superficial left breast at 330 4 cm from the nipple with mildly
irregular borders and a solid component versus debris measuring 1 x
0.7 x 1.2 cm.

Several lymph nodes in the left axilla all containing fatty hila are
seen. No definite axillary lymphadenopathy.
IMPRESSION: Indeterminate left breast mass, which could represent a
debris-filled cyst although a solid component cannot be excluded.

RECOMMENDATION:
Ultrasound-guided biopsy of the mass in the left breast at [DATE] is
recommended. This will be scheduled at the patient's convenience.

I have discussed the findings and recommendations with the patient.
Results were also provided in writing at the conclusion of the
visit. If applicable, a reminder letter will be sent to the patient
regarding the next appointment.

BI-RADS CATEGORY  4: Suspicious.
